# Patient Record
Sex: Female | Born: 1935
[De-identification: ages and names within clinical notes are randomized; demographics above are authoritative.]

---

## 2012-08-04 IMAGING — MG MAMMOGRAPHY SCREENING BILATERAL 3D TOMOSYNTHESIS WITH CAD
12 of 16 series · 12 of 32 positions shown · non-contrast
Comparison: Comparison is made to prior exams dating back to 07/09/06.

MAMMOGRAPHY SCREENING 3D TOMOSYNTHESIS WITH CAD, 08/04/12:
TECHNIQUE: Bilateral oblique mediolateral and craniocaudal full field
digital mammograms and 3D tomosynthesis were obtained.   In addition,
computer aided detection was utilized.
CLINICAL HISTORY: Screening.
BREAST DENSITY:  (Level 3 of 4) The breast tissue is heterogeneously dense.
This may lower the sensitivity of mammography.

[R CC synth-2D]
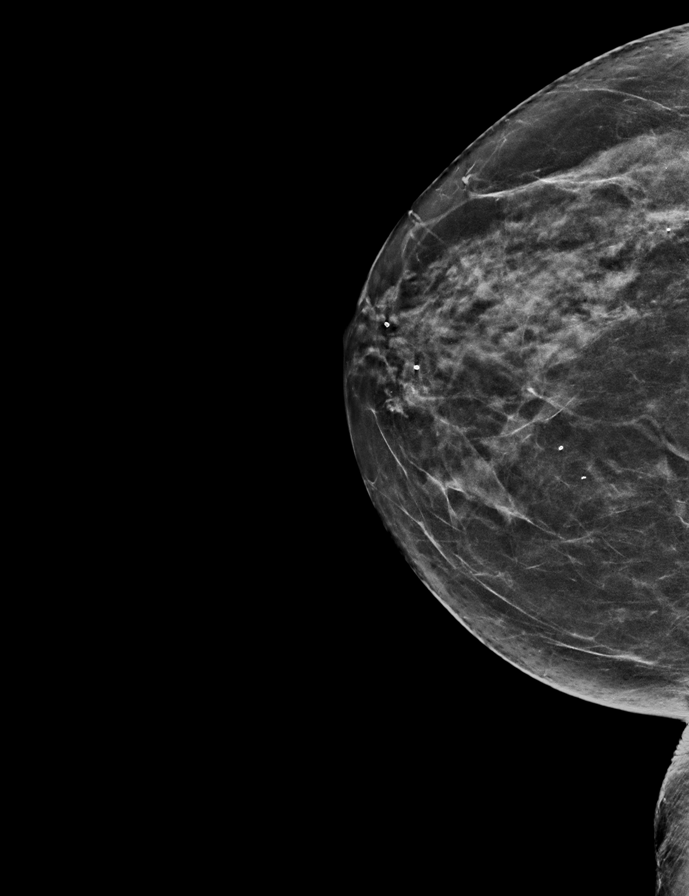

[R MLO]
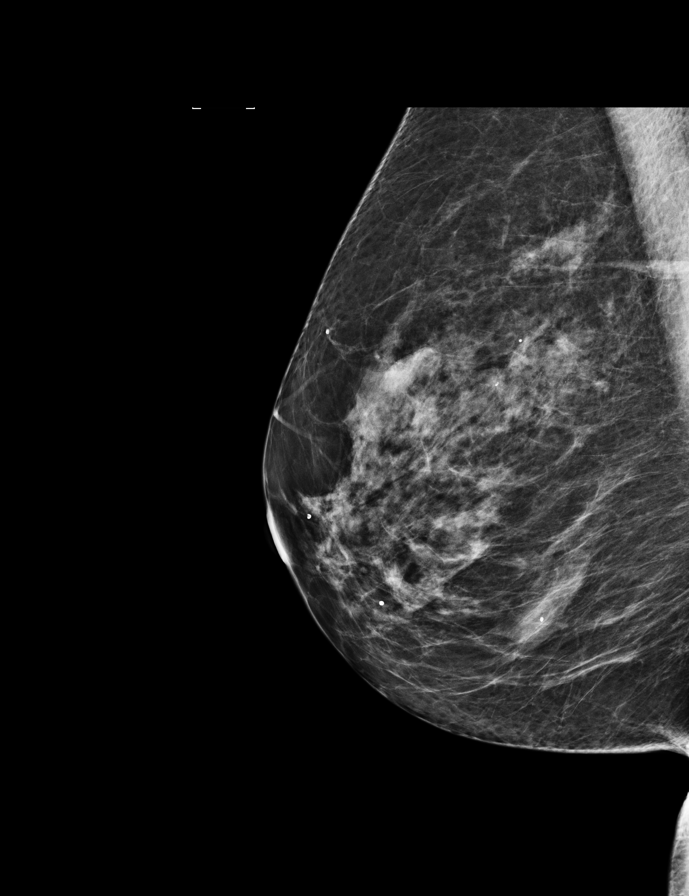

[L CC synth-2D]
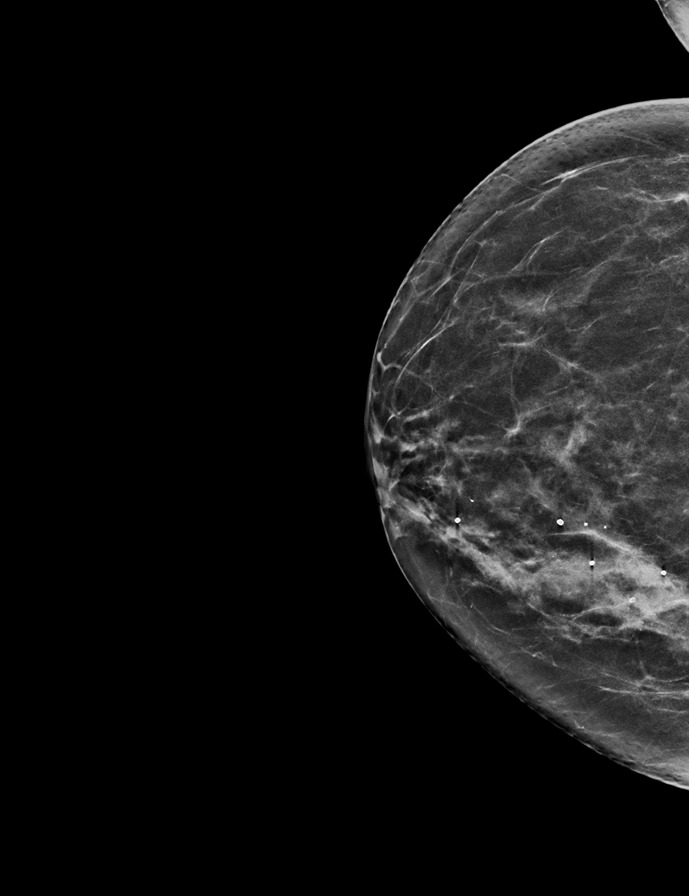

[R MLO synth-2D]
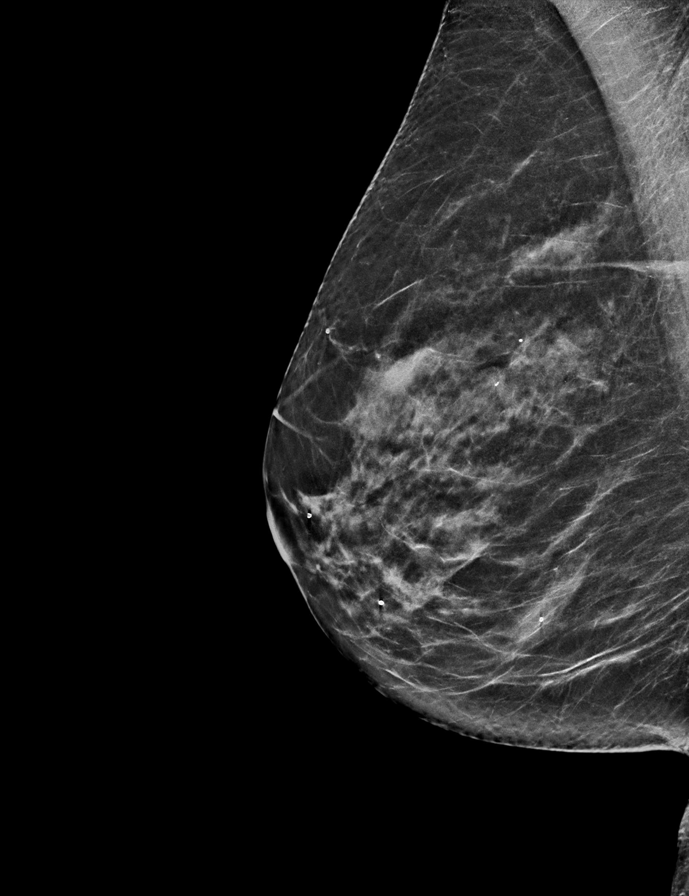

[L MLO]
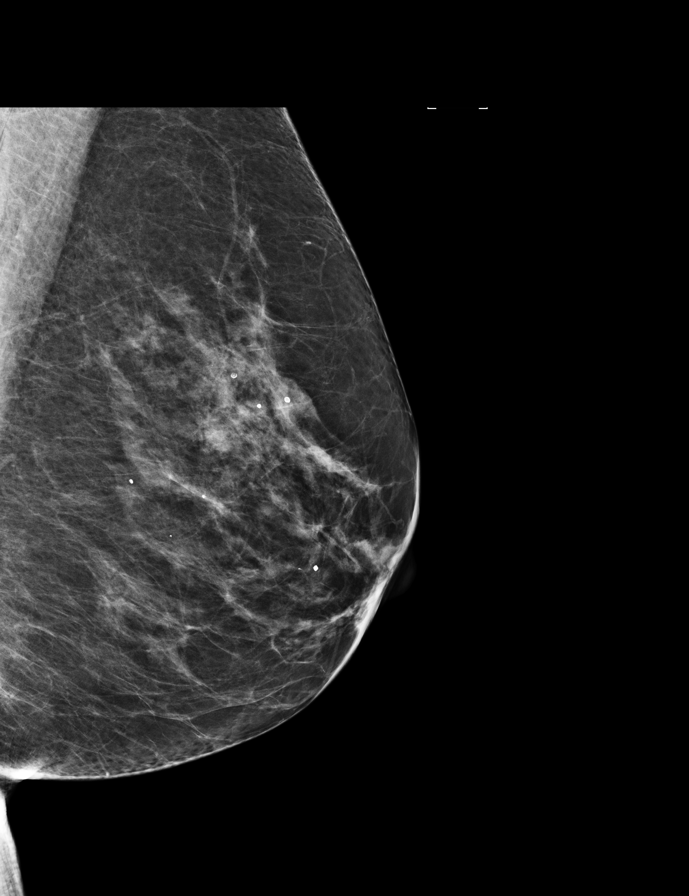

[L MLO synth-2D]
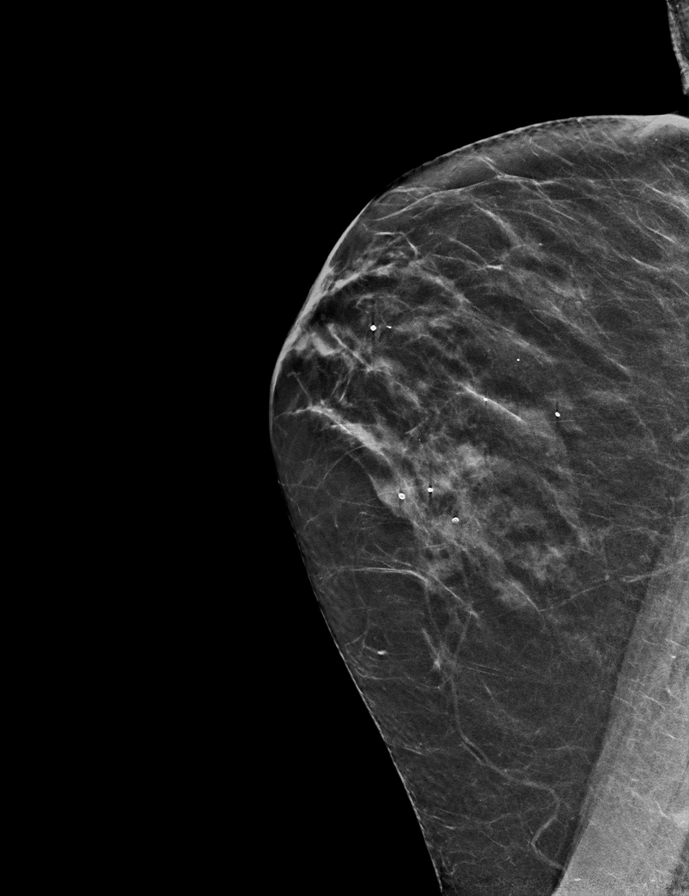

[R CC]
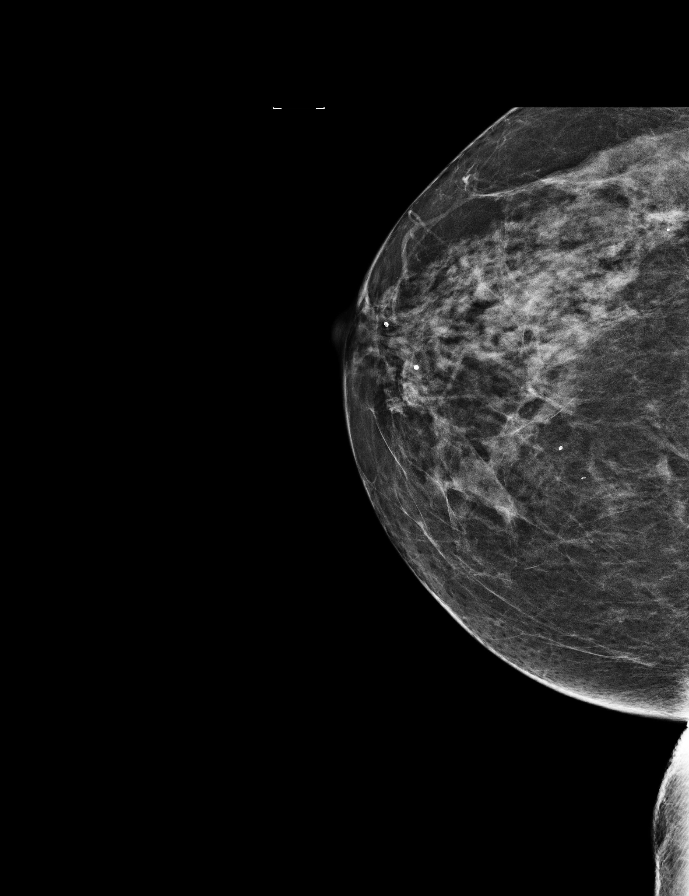

[L CC]
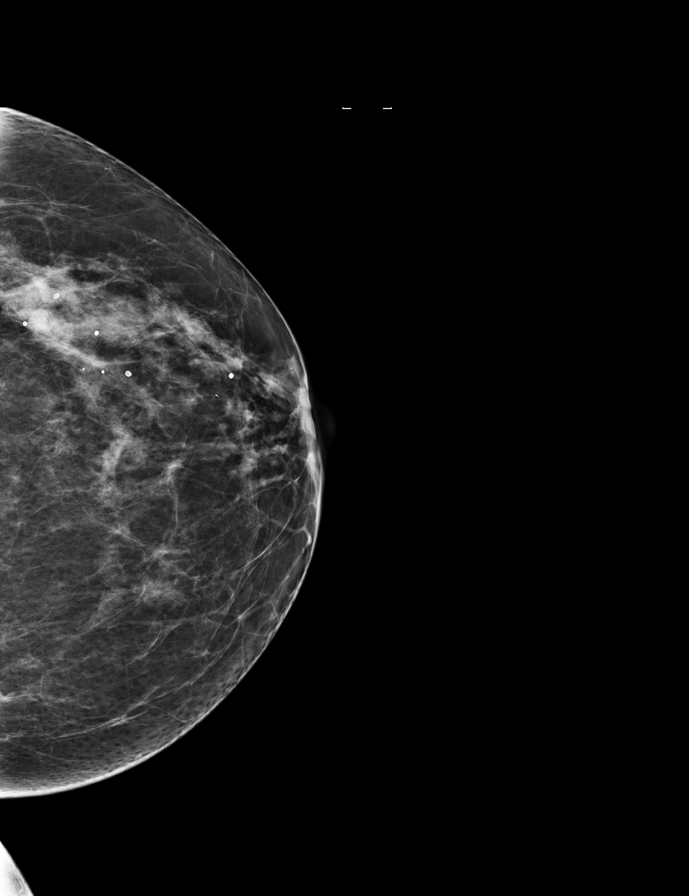

[R MLO tomo]
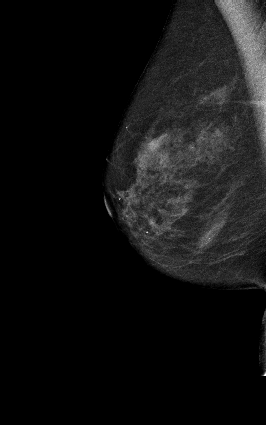

[L CC tomo]
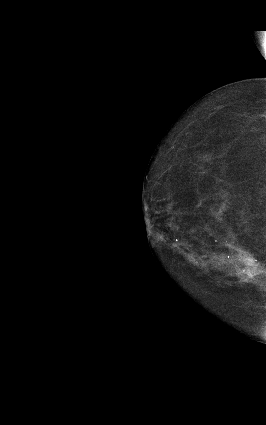

[R CC tomo]
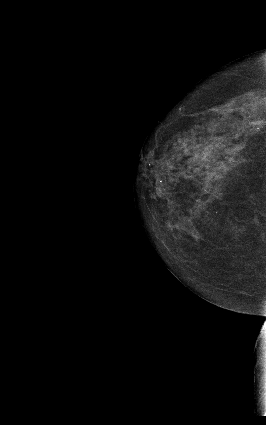

[L MLO tomo]
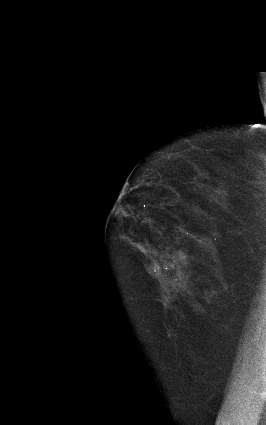

[12 of 32 positions shown; findings below may reference images not displayed]

FINDINGS: There are benign calcifications in both breasts.  There is a
developing nodule in the lateral left breast for which ultrasound is
recommended.
IMPRESSION: (BI-RADS 0) Further evaluation will be performed as discussed above, and
the results will be reported separately.

## 2013-08-28 IMAGING — MG MAMMOGRAPHY SCREENING BILATERAL 3D TOMOSYNTHESIS WITH CAD
12 of 16 series · 12 of 32 positions shown · non-contrast
Comparison: none

MAMMOGRAPHY SCREENING 3D TOMOSYNTHESIS WITH CAD, 08/28/2013 [DATE]:
Bilateral oblique mediolateral and craniocaudal full field digital mammogram
and
3-D tomosynthesis were obtained on 08/28/2013 in this patient undergoing
screening who is 80 years old.  In addition, computer-aided detection was
utilized.
Comparison is made to a study from 08/04/2012.
BREAST DENSITY: ( Level 3 of 4) The breast tissue is heterogeneously dense.
This
may lower the sensitivity of mammography.

[L MLO synth-2D]
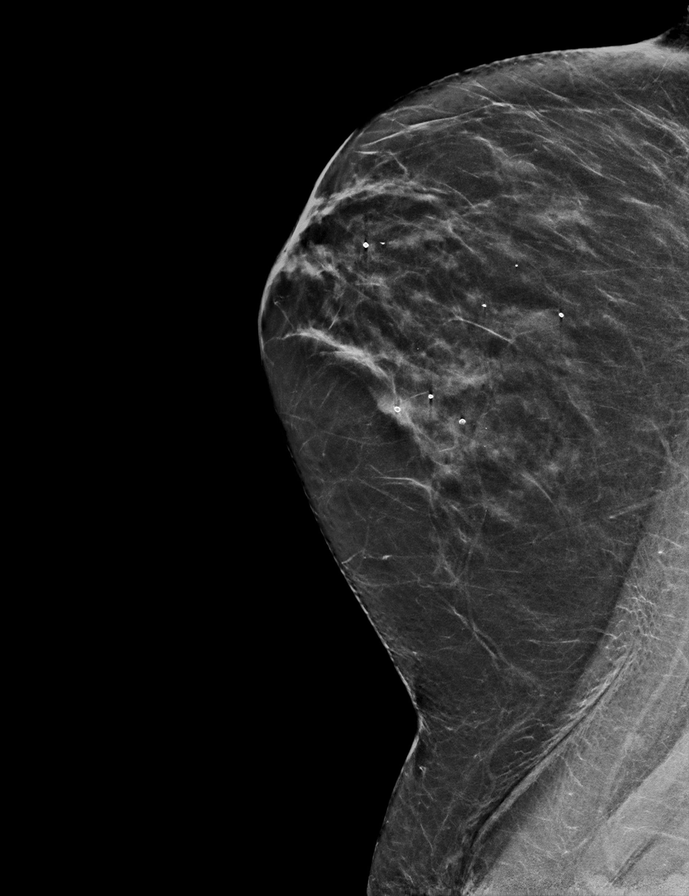

[L MLO]
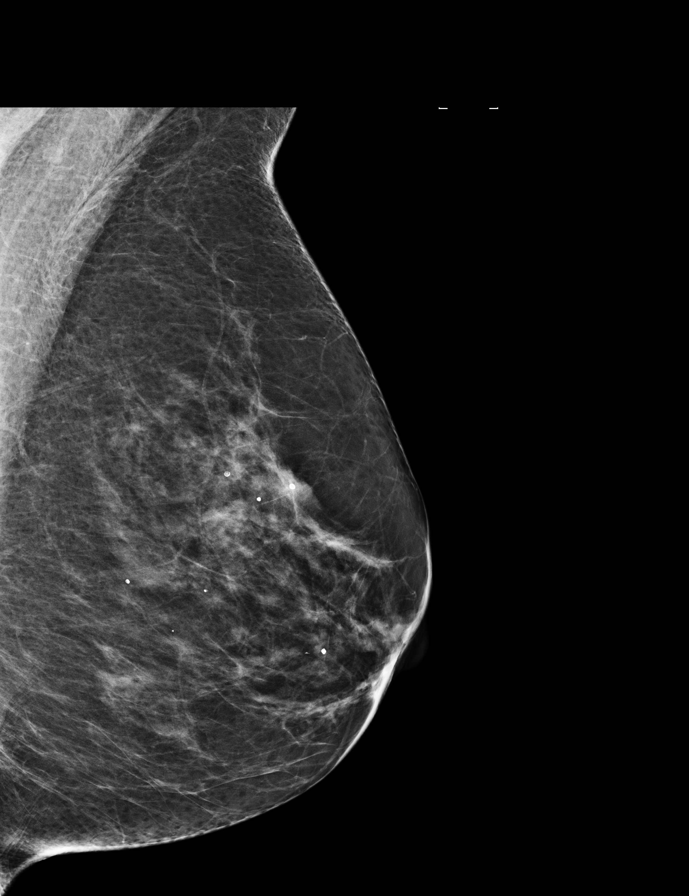

[R MLO synth-2D]
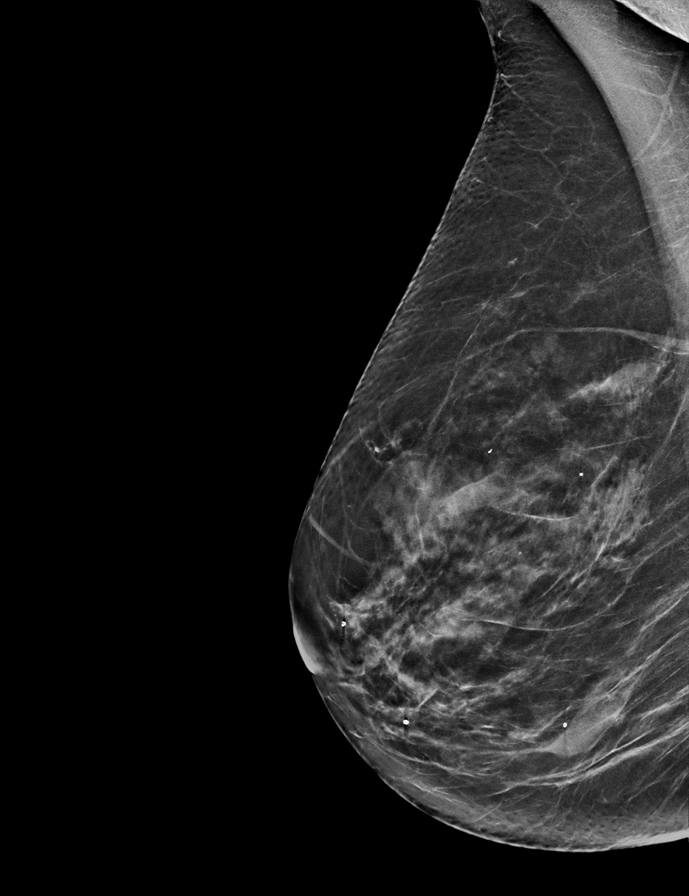

[L CC synth-2D]
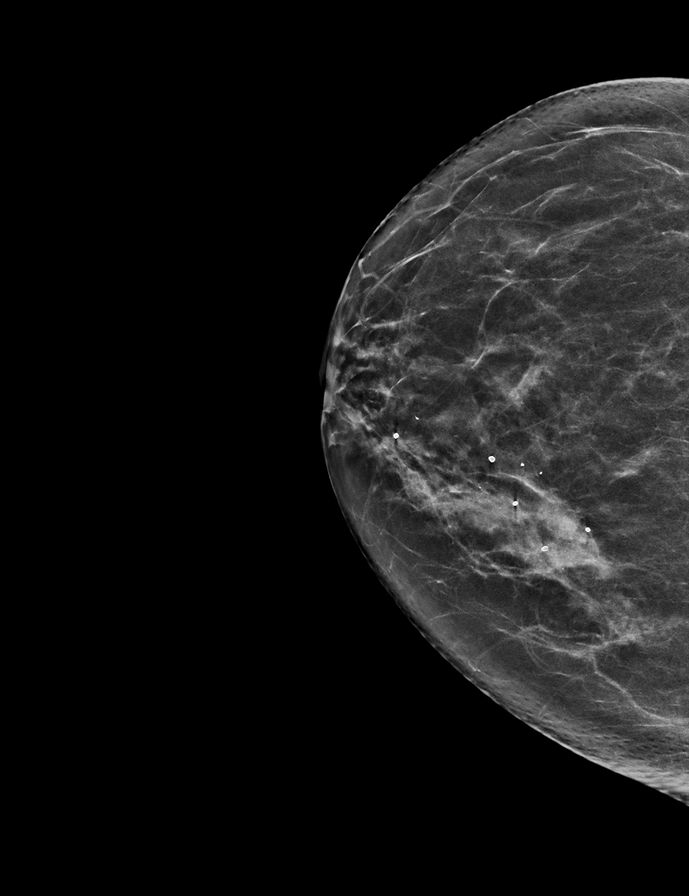

[R MLO]
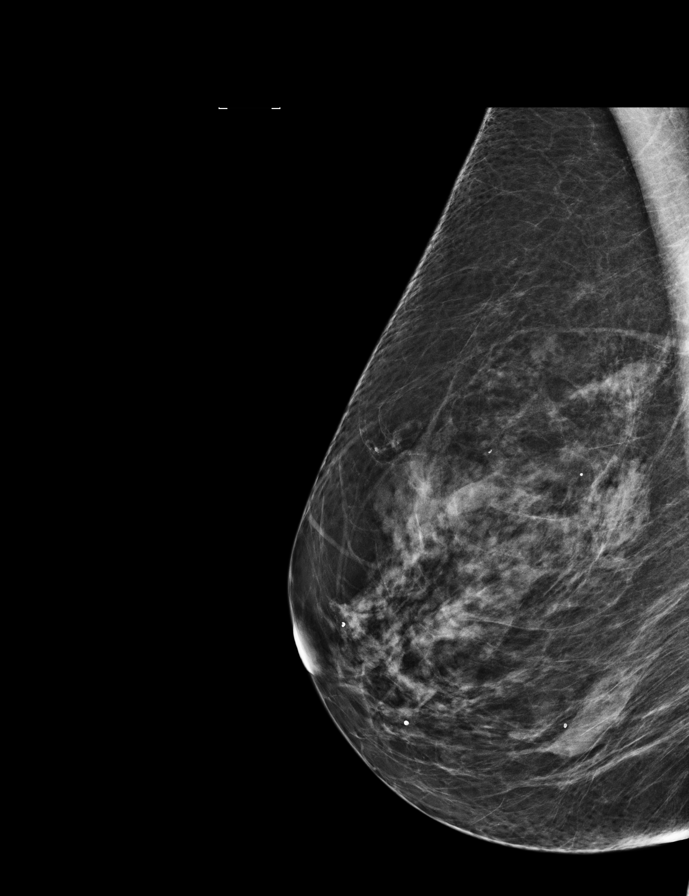

[R CC]
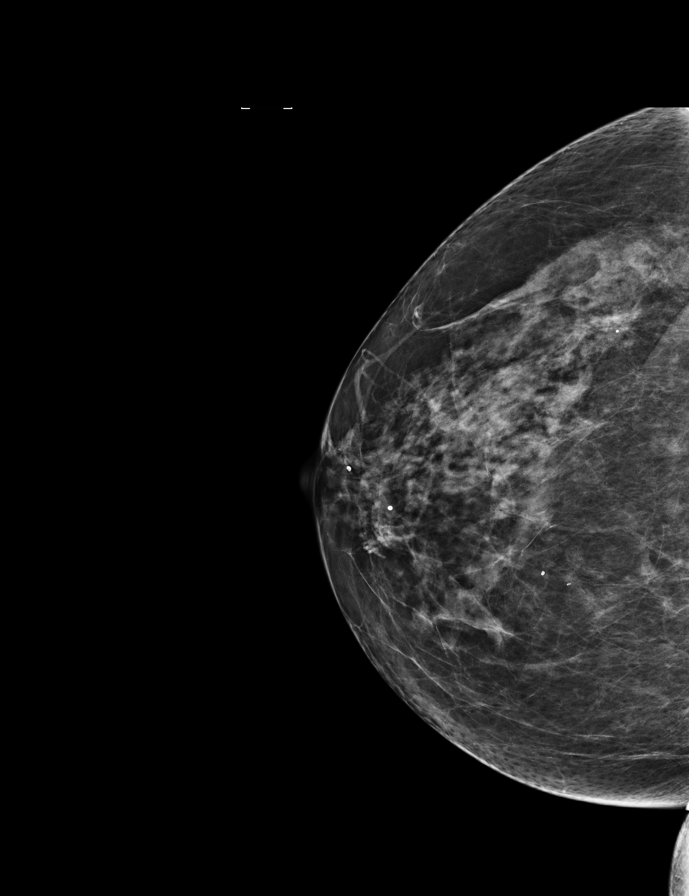

[L CC]
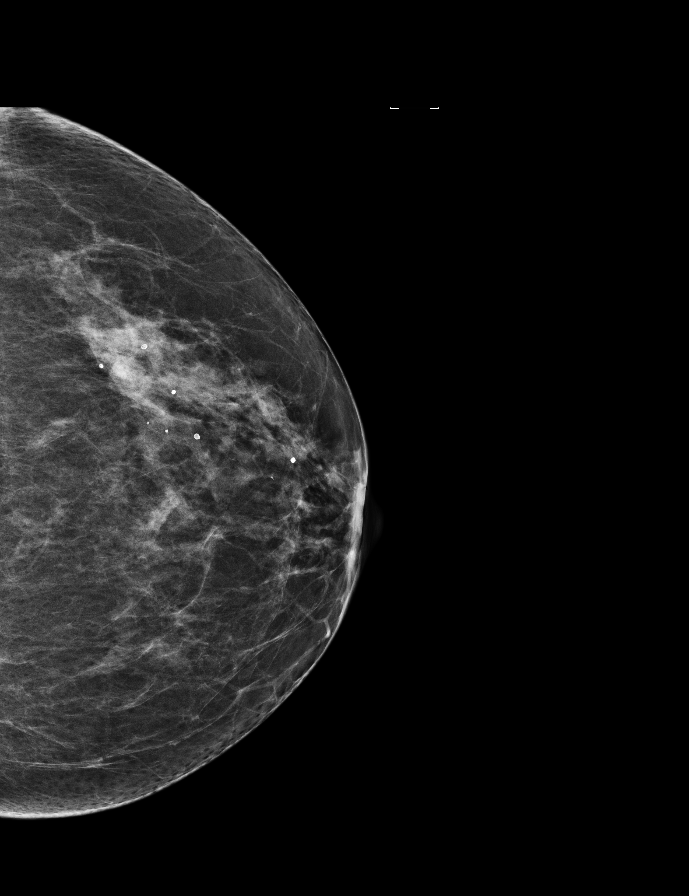

[R CC synth-2D]
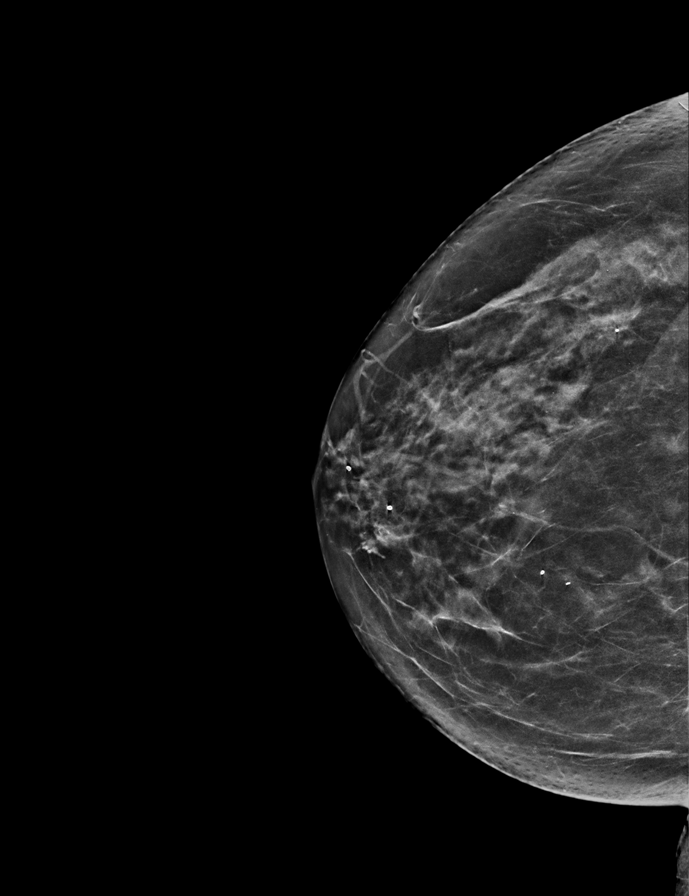

[R MLO tomo]
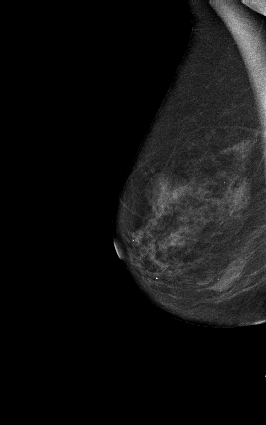

[L CC tomo]
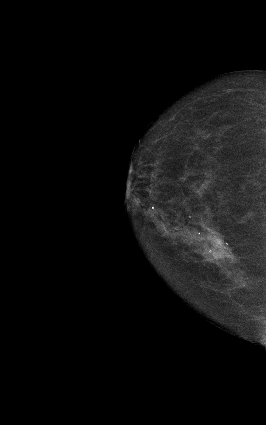

[R CC tomo]
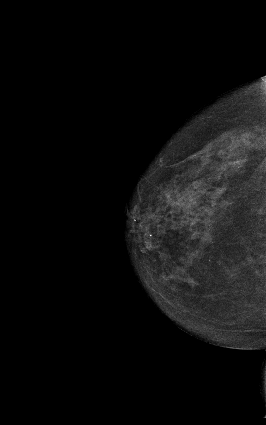

[L MLO tomo]
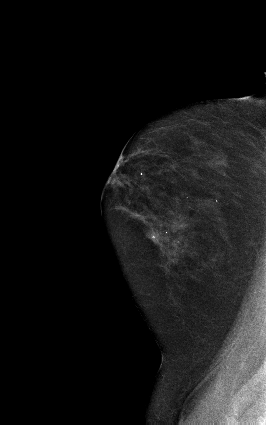

[12 of 32 positions shown; findings below may reference images not displayed]

FINDINGS: The breasts are symmetric. There are no dominant mass or clustered
microscopic calcifications. There is no skin thickening. The tomosynthesis
images demonstrate no area of concern. Benign calcifications are seen in both
breasts.
IMPRESSION: ( BI-RADS 2) Benign findings. Routine mammographic follow-up is recommended.

## 2014-08-31 IMAGING — MG MAMMOGRAPHY SCREENING BILATERAL 3D TOMOSYNTHESIS WITH CAD
12 of 16 series · 12 of 32 positions shown · non-contrast
Comparison: August 28, 2013
BREAST DENSITY: (Level C) The breasts are heterogeneously dense, which may
obscure small masses.

MAMMOGRAPHY SCREENING 3D TOMOSYNTHESIS WITH CAD, 08/31/2014 [DATE]:
CLINICAL INDICATION: Screening
TECHNIQUE: Bilateral oblique mediolateral and craniocaudal full field digital
mammogram and 3-D Tomosynthesis were obtained.  In addition, computer-aided
detection was utilized.

[R MLO]
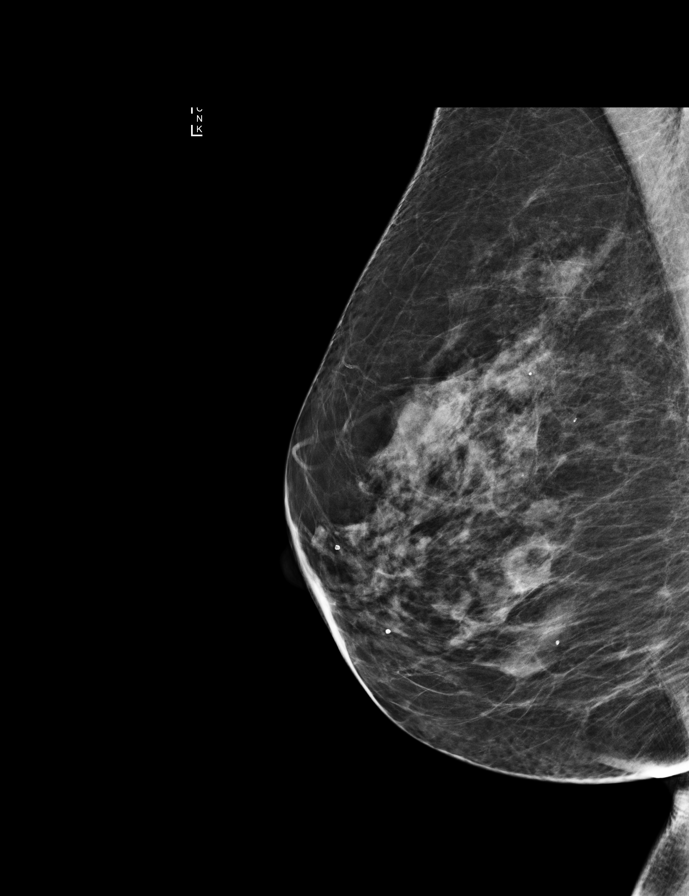

[R CC synth-2D]
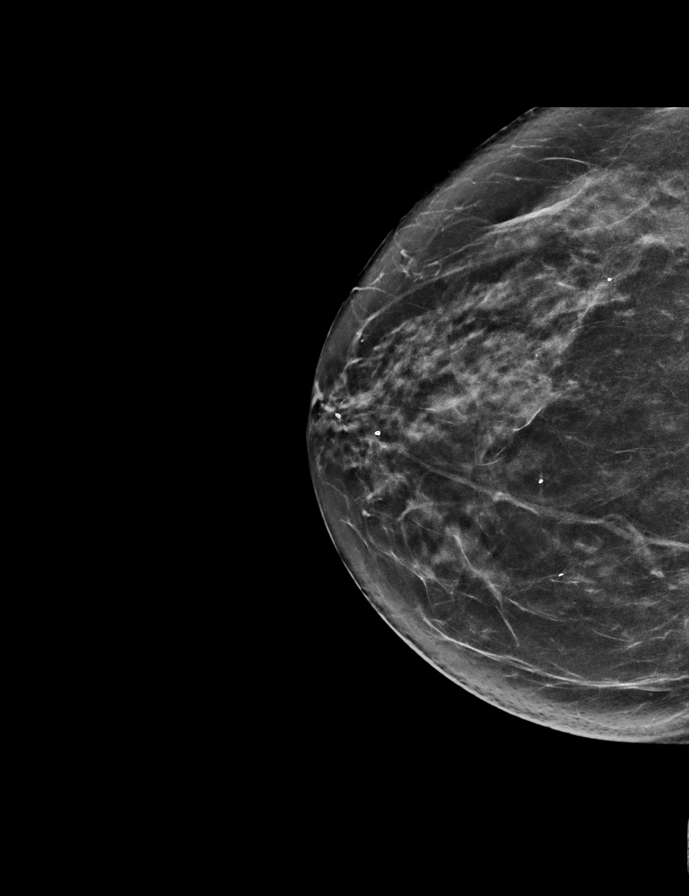

[L CC synth-2D]
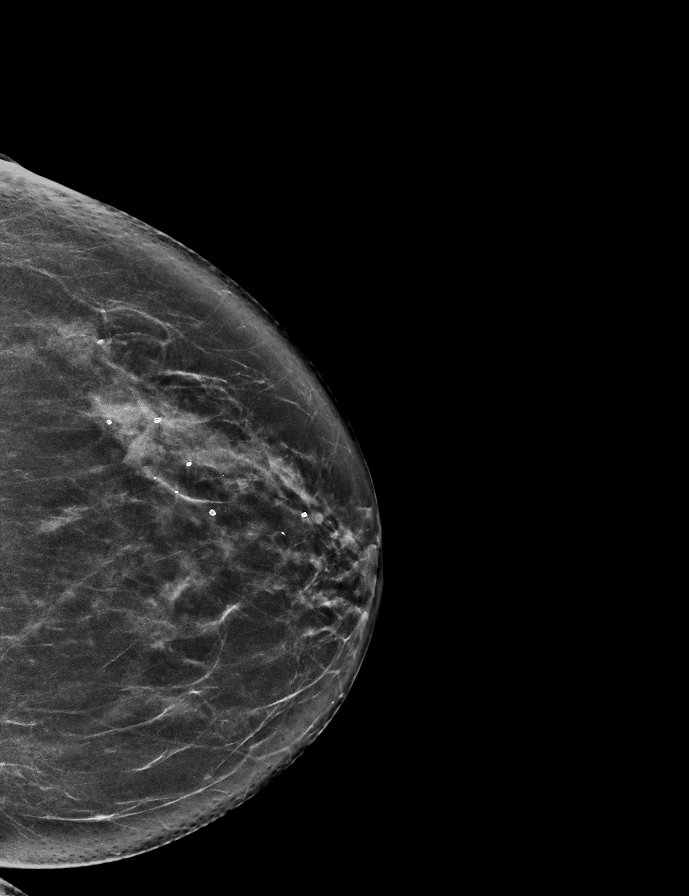

[L MLO]
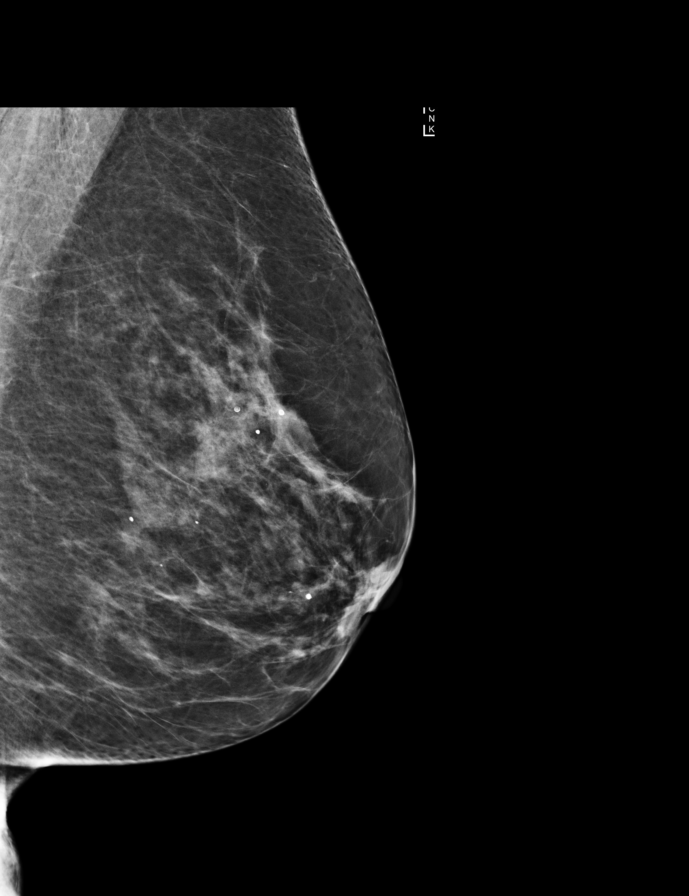

[R CC]
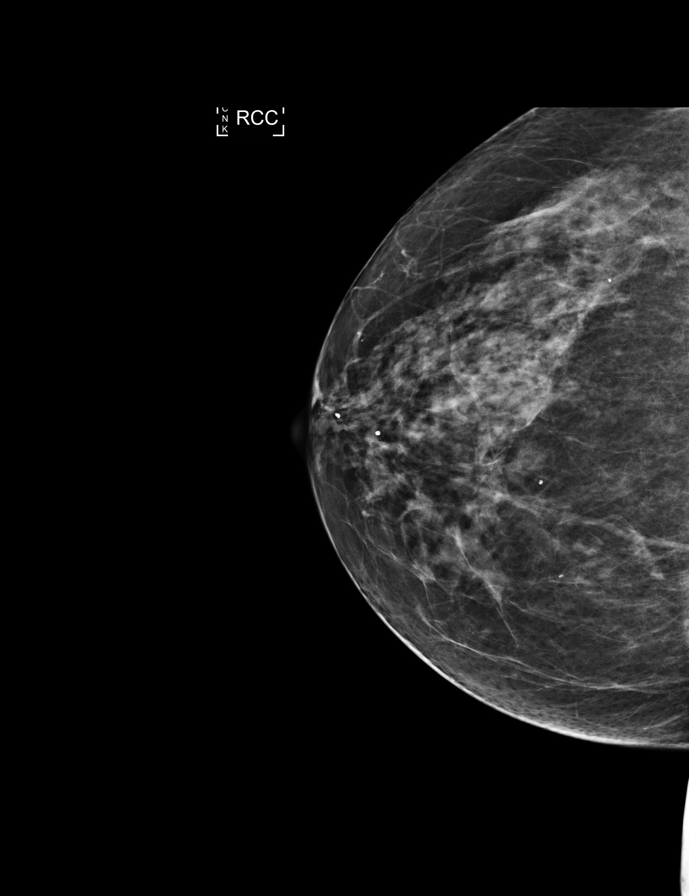

[L MLO synth-2D]
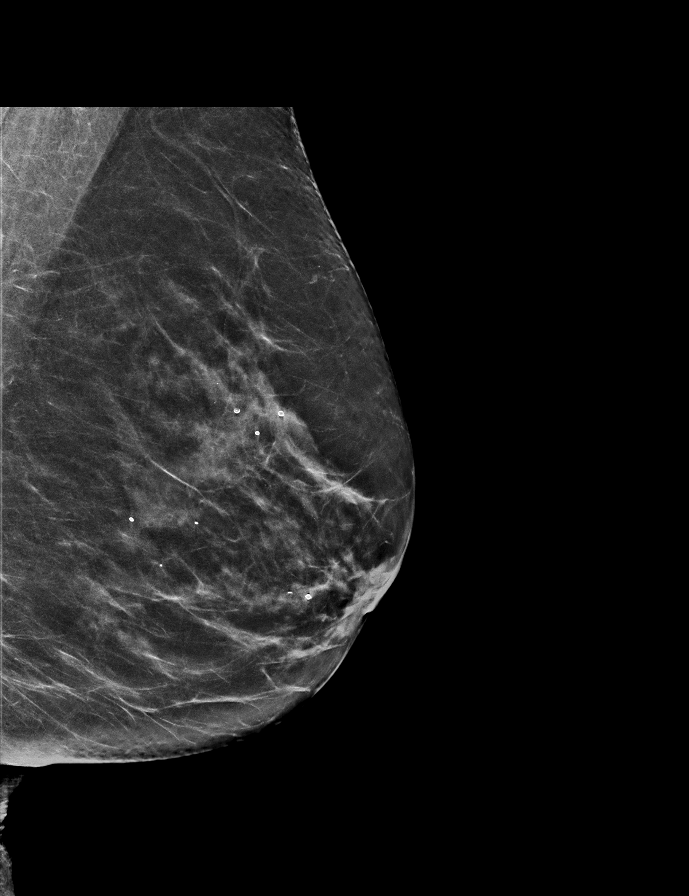

[R MLO synth-2D]
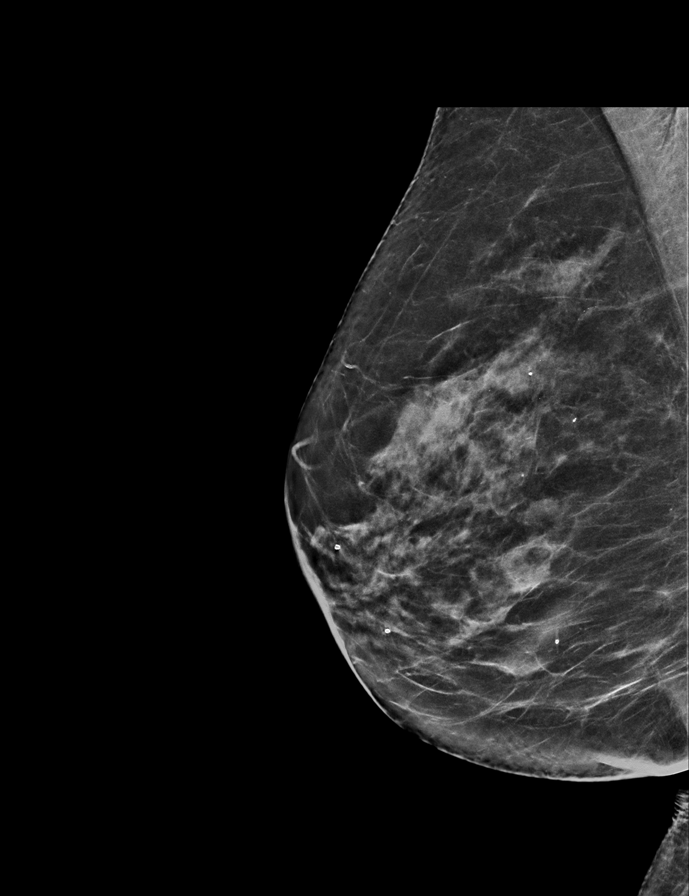

[L CC]
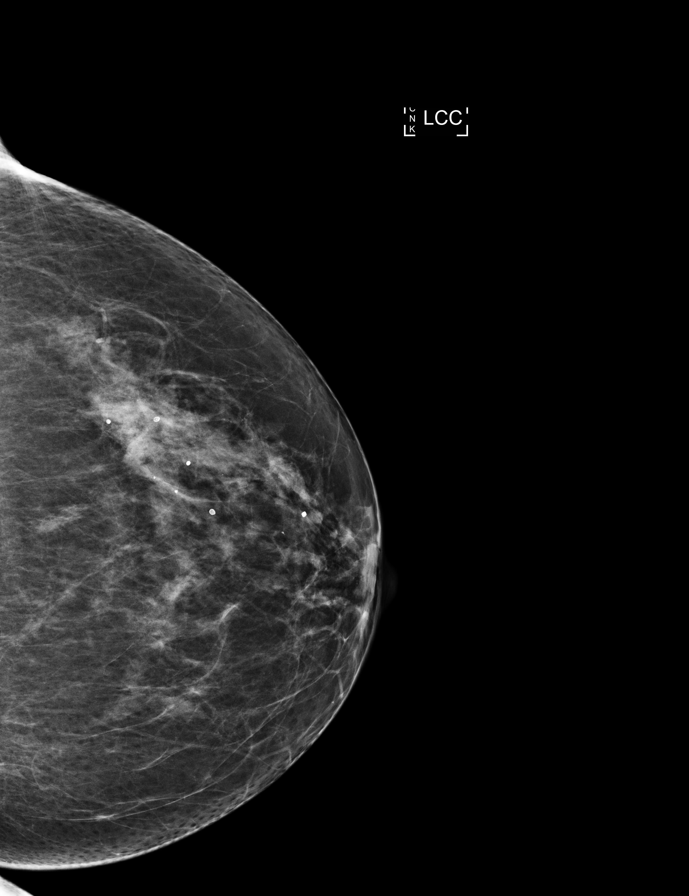

[L CC tomo]
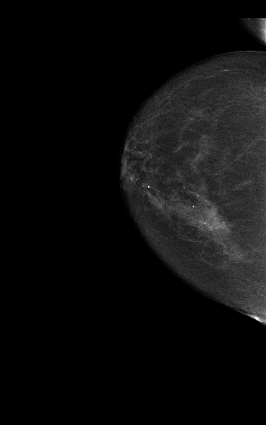

[R MLO tomo]
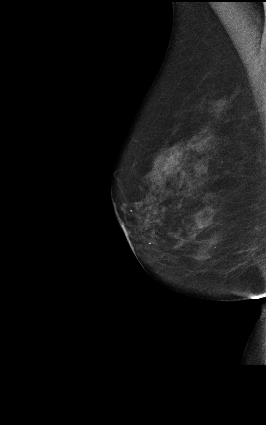

[R CC tomo]
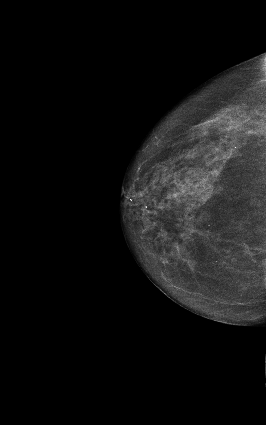

[L MLO tomo]
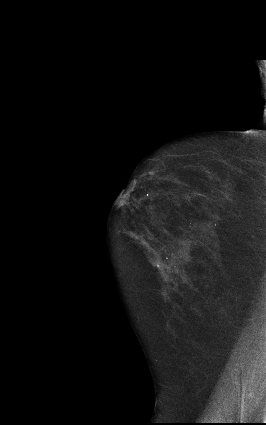

[12 of 32 positions shown; findings below may reference images not displayed]

FINDINGS: There are benign calcifications. There is no dominant or new,
developing mass or suspicious cluster of microcalcifications. There is no skin
thickening, architectural distortion or nipple retraction.
IMPRESSION: ( BI-RADS 2) Benign findings. Routine mammographic follow-up is recommended.

## 2015-04-29 IMAGING — CT CT ABDOMEN AND PELVIS WITH CONTRAST
2 of 3 series · 16 of 46 positions shown, 18 images · IV contrast (APPLIED)
Comparison: There are no prior exam(s) available for comparison within the
past
12 months; however, comparison was made to the prior exam(s) dated  CT scan
07/27/2010

CT ABDOMEN AND PELVIS WITH CONTRAST, 04/29/2015 [DATE]:
CLINICAL INDICATION:  Left lower quadrant pain. Evaluate for diverticulitis on
antibiotics for 2 days. Previous smoker.
A search for DICOM formatted images was conducted for prior CT imaging studies
completed at a non-affiliated media free facility.
TECHNIQUE: The region of interest was scanned with contrast on a high
resolution low dose CT scanner.   100 cc's of Isovue 300 was injected
intravenously.  Routine MPR reconstructions were performed.

[Series 5: portal 3.0 i41s 2 · axial · portal-venous · 0.79mm/px · z∈[-481,-94]mm · 13 of 149 slices shown, 15 images]
[im 10/149  soft-tissue]
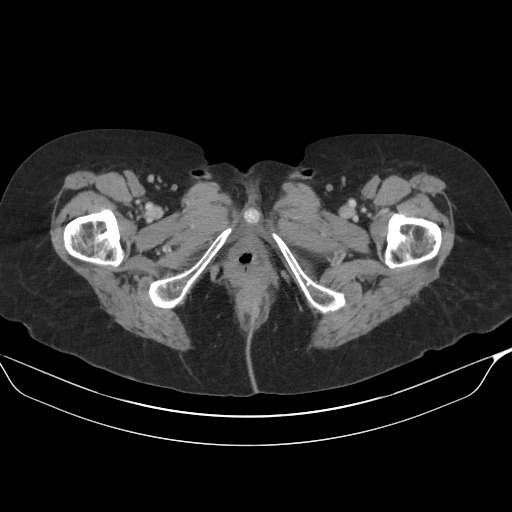
[im 10/149  bone]
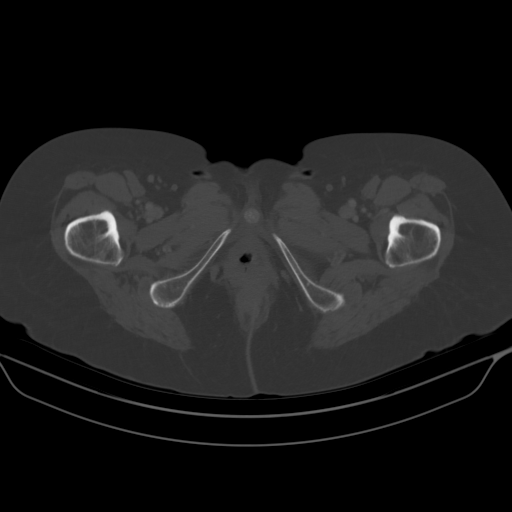
[im 20/149  soft-tissue]
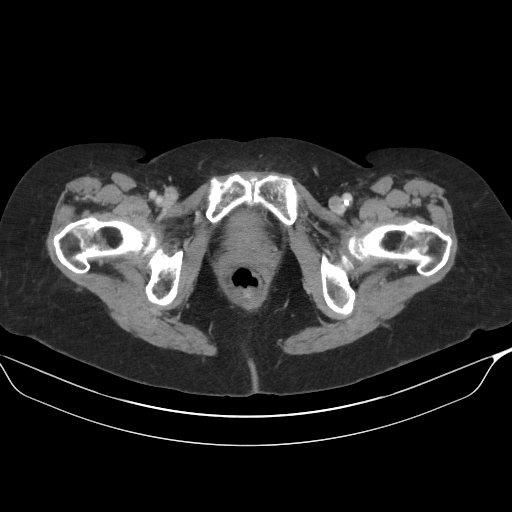
[im 29/149  soft-tissue]
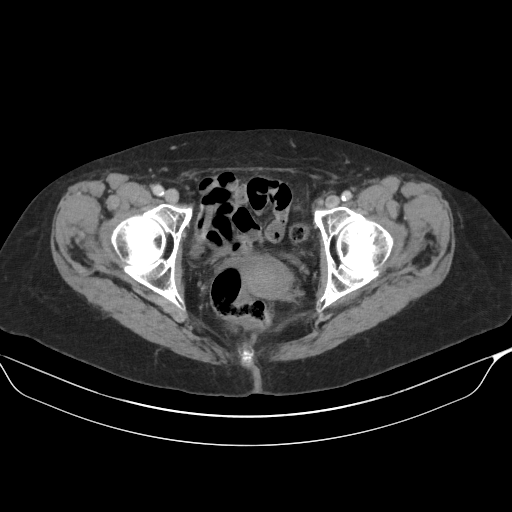
[im 43/149  soft-tissue]
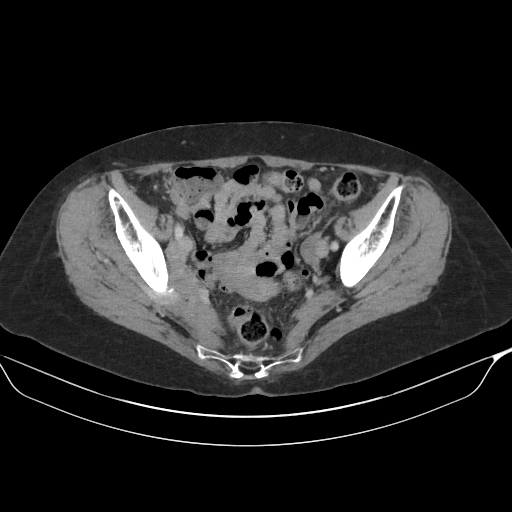
[im 53/149  soft-tissue]
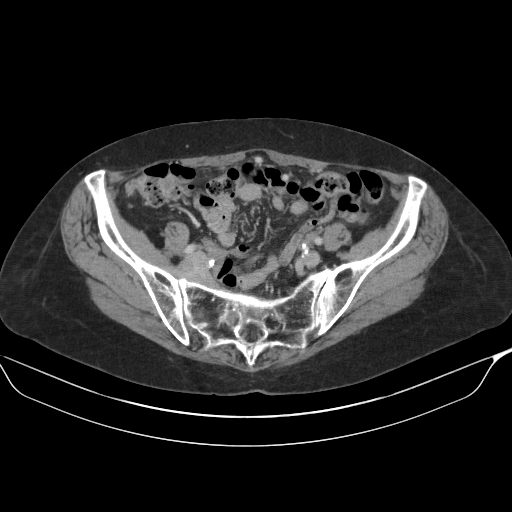
[im 63/149  soft-tissue]
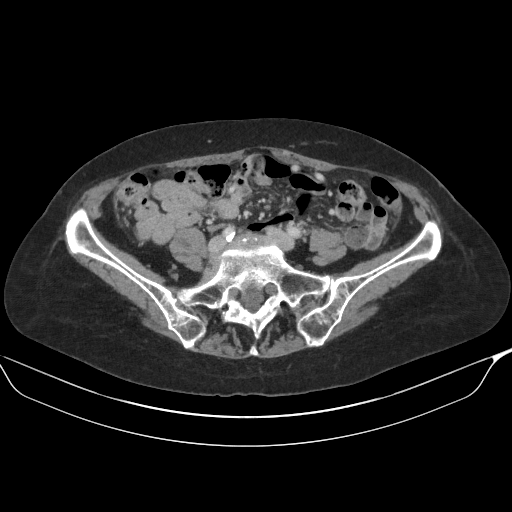
[im 77/149  soft-tissue]
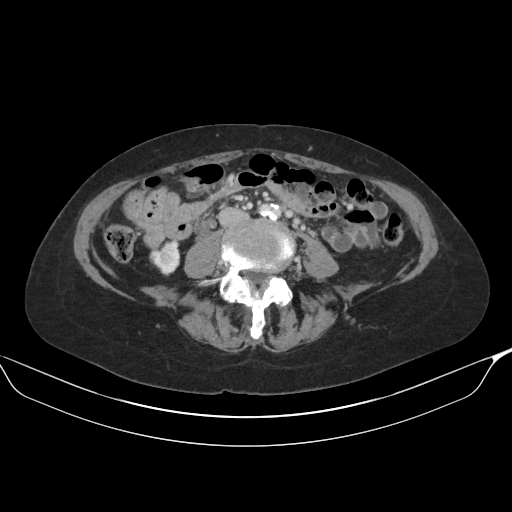
[im 86/149  soft-tissue]
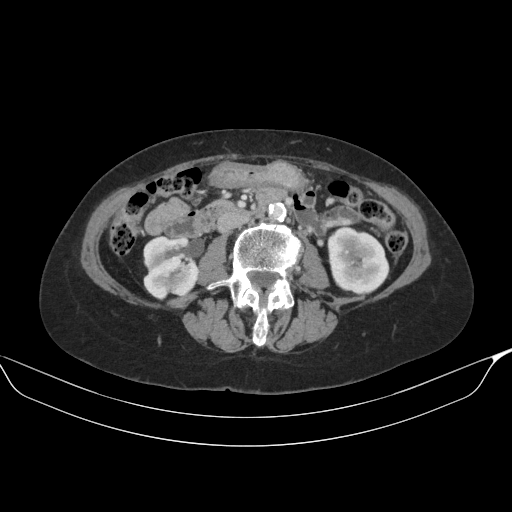
[im 96/149  soft-tissue]
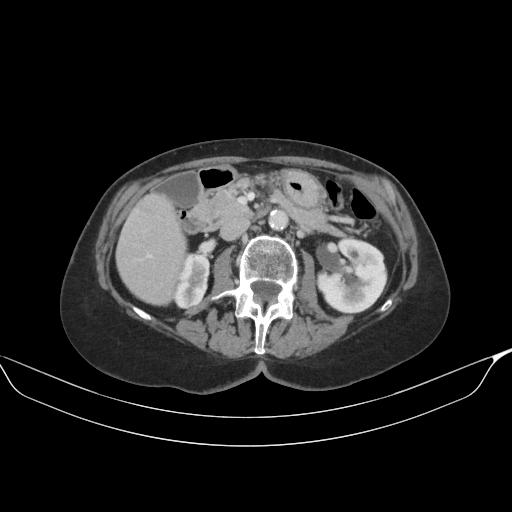
[im 96/149  bone]
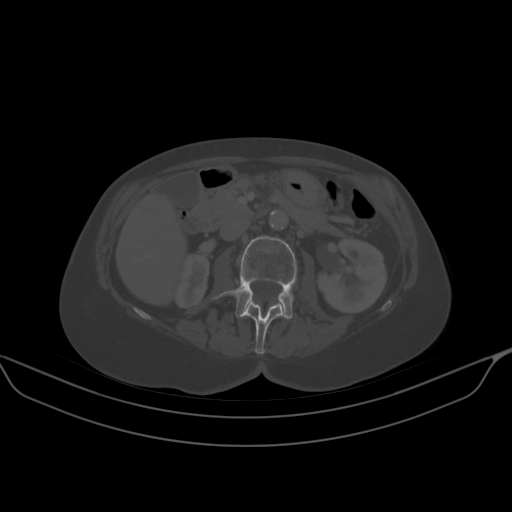
[im 106/149  soft-tissue]
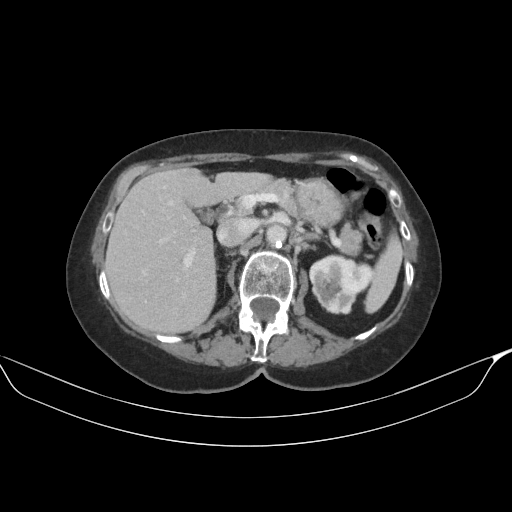
[im 120/149  soft-tissue]
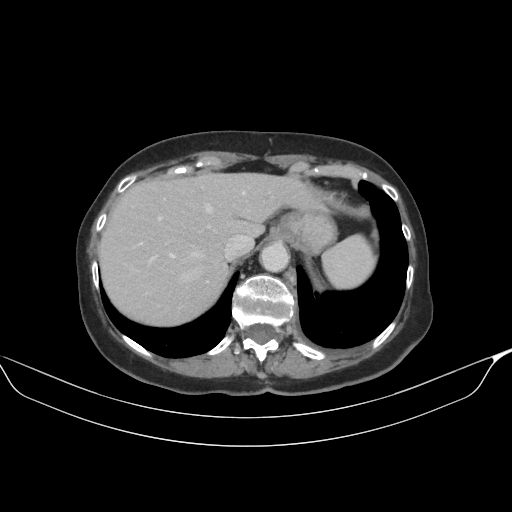
[im 129/149  soft-tissue]
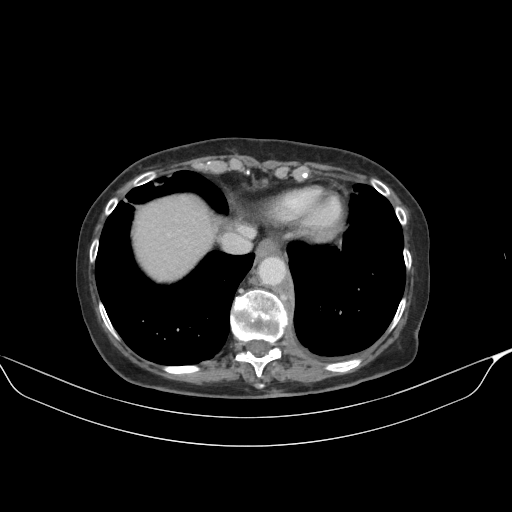
[im 139/149  soft-tissue]
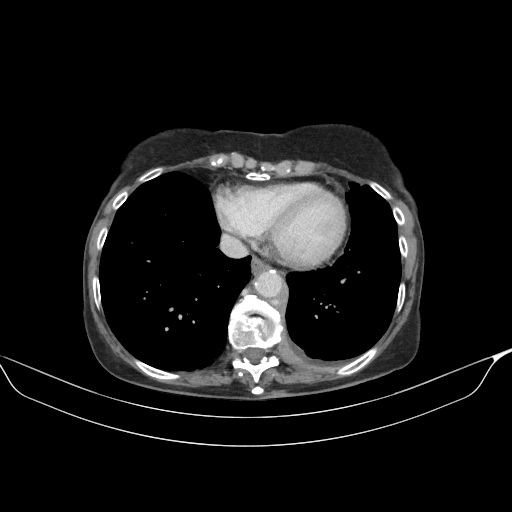

[Series 6: coronal · coronal · 0.79mm/px · 3 of 101 slices shown]
[im 34/101  soft-tissue]
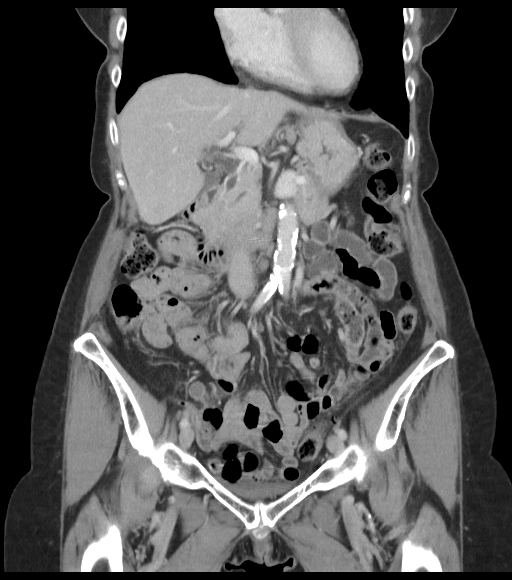
[im 45/101  soft-tissue]
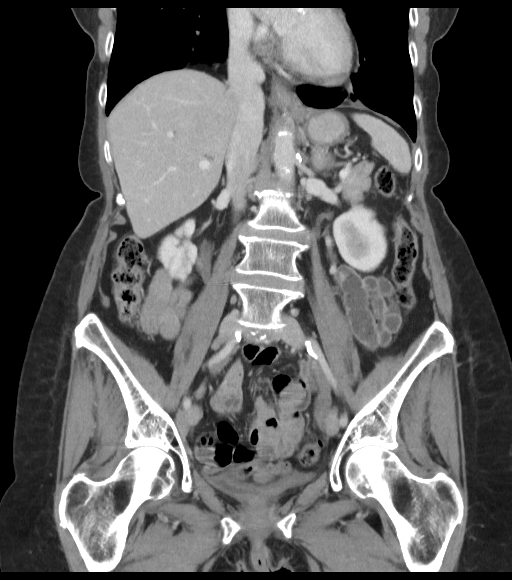
[im 56/101  soft-tissue]
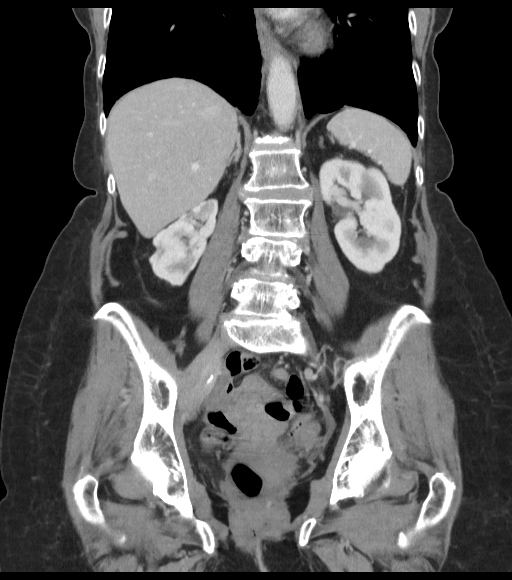

[16 of 46 positions shown; findings below may reference images not displayed]

FINDINGS: There appears to be progressive inflammatory changes within the
lingula, right middle lobe and right lower lobe since the prior exam. This has
progressed and is concerning for bronchiolitis possibly even an atypical
mycobacterial infection. Not fully evaluated on this exam. There appears to be
a
developing small left pleural effusion since the prior exam. There also
appears
to be pleural thickening and increased soft tissue seen deep to the crus of
the
left diaphragm and along the left paraspinous region. This does not deviate
the
course of the adjacent venous structures. The etiology of this is uncertain.
The
significance is uncertain. Interestingly this is somewhat similar to the
abnormality seen within the pelvis deep to the right iliac vessels which was
described as stable on the prior exam and seen on exams dating all back to
8660.
There are atherosclerotic changes and there are degenerative changes. Small
cysts are seen within the liver. The liver, spleen, pancreas and adrenal
glands
are normal in appearance. There is lobulation of the kidneys bilaterally
chronic
in nature either related to old inflammation or possible fetal lobulations. No
new adenopathy or mass seen. Stable appearance the pelvis. The bladder is
unremarkable in appearance. Mild inflammatory changes are seen around a
diverticulum within the sigmoid colon without abscess or free fluid seen.
IMPRESSION: Findings suspicious for mild sigmoid diverticulitis.
Unusual increased soft tissue in thickening along the medial aspect of the
left
lower thorax extending down to the crus of the diaphragm on the left. This is
a
new finding since the 1300 exam. May be inflammatory though is not deviating
the
course of the underlying vasculature. PET scan may be beneficial to determine
if
this is FDG avid and if it possibly requires biopsy.
New trace left pleural effusion.
Progressive inflammatory changes in the lung bases.
Suspect chronic fetal lobulation of the kidneys.
Atherosclerotic change and degenerative changes.
Chronic increased soft tissue right posterior pelvis similar to the prior exam
and stated to be similar exams dated back to [DATE].

## 2015-05-13 IMAGING — PT PET CT SCAN TUMOR IMAGING SKULL TO THIGH
3 series · 25 of 25 positions shown · non-contrast
Comparison: CT scan 04/29/2015

PET CT SCAN TUMOR IMAGING SKULL TO THIGH, 05/13/2015 [DATE]:
CLINICAL INDICATION:  Abnormal CT scan with abnormal soft tissue left lower
thorax
TECHNIQUE: A dose of 12.7 millicuries of 18-FDG was administered intravenously
and total body PET scanning was performed at 75 minutes. Tomographic scans
were
reconstructed in axial, coronal, and sagittal projections. The data was
reconstructed into a three-dimensional volume rendered image and reviewed in a
rotational cine loop. Serum blood glucose at the time of injection was 99
mg/dl.

[Series 2: body-low dose ct · axial · 5.0mm · 1.17mm/px · z∈[-984,-44]mm · 7 of 189 slices shown]
[im 1/189]
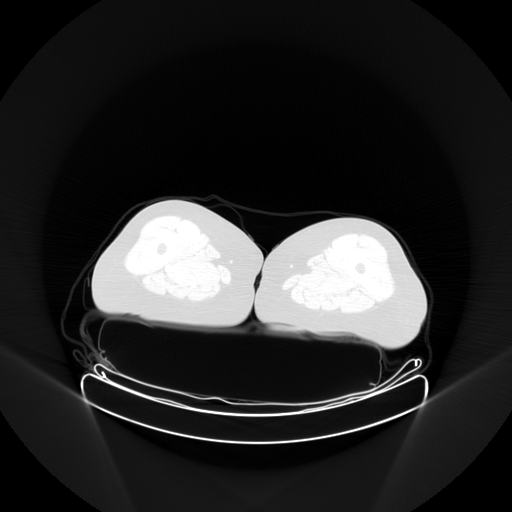
[im 32/189]
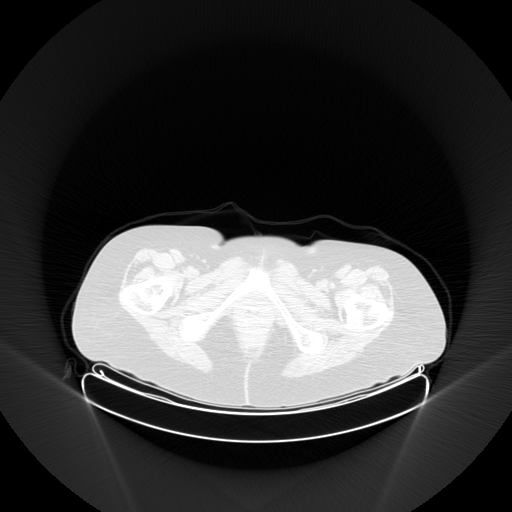
[im 63/189]
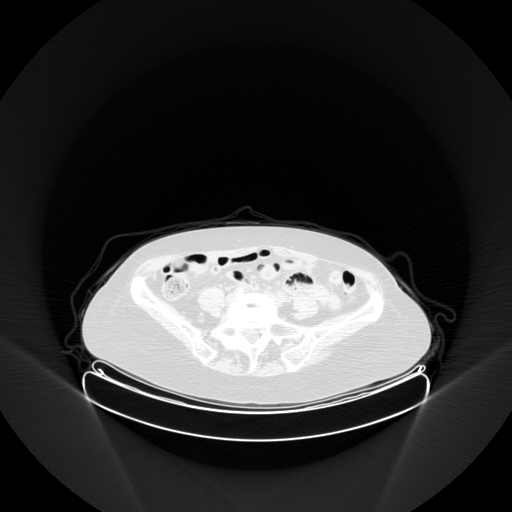
[im 95/189]
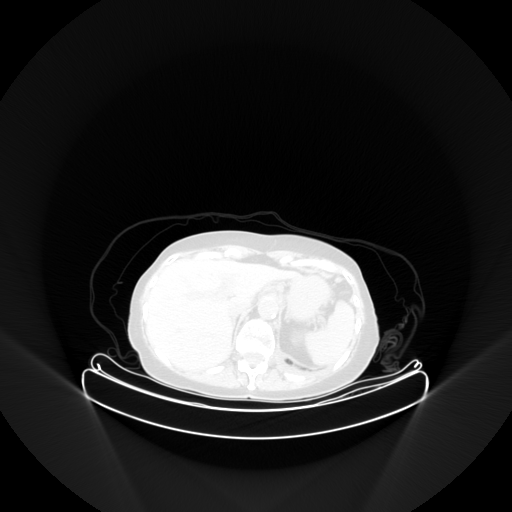
[im 126/189]
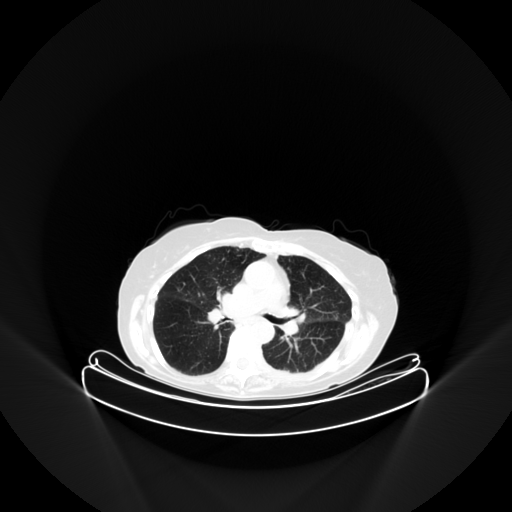
[im 157/189]
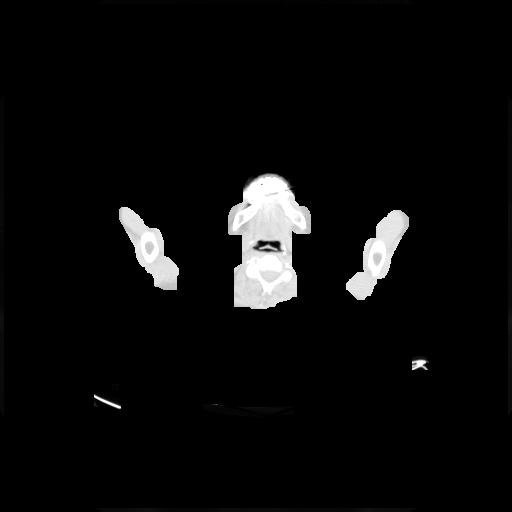
[im 189/189]
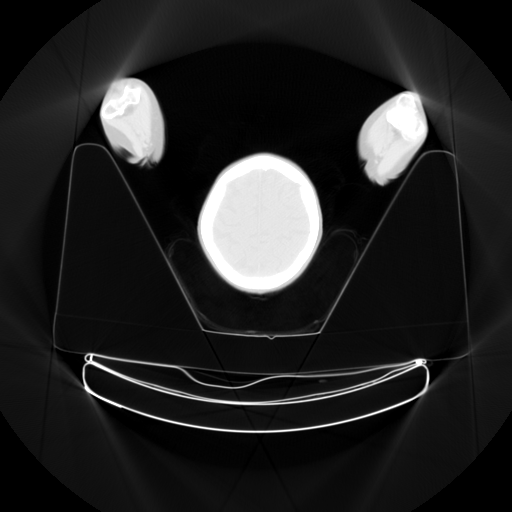

[(id)_wb_ctac.img: (id) · axial · 4.0mm · 4.00mm/px · z∈[-980,-48]mm · 9 of 234 slices shown]
[im 1/234]
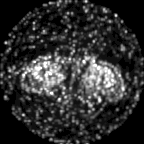
[im 30/234]
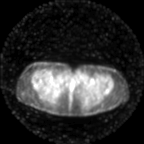
[im 59/234]
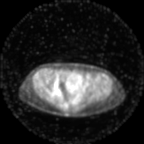
[im 88/234]
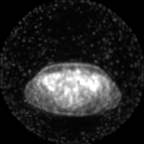
[im 117/234]
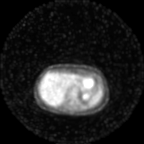
[im 146/234]
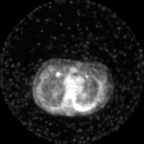
[im 175/234]
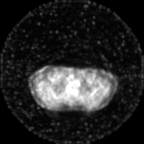
[im 204/234]
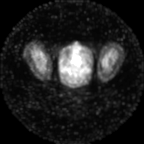
[im 234/234]
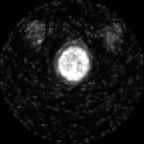

[(id)_wb_nac.img: (id) · axial · 4.0mm · 4.00mm/px · z∈[-980,-48]mm · 9 of 234 slices shown]
[im 1/234]
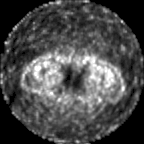
[im 30/234]
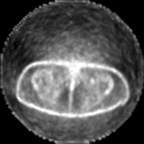
[im 59/234]
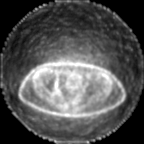
[im 88/234]
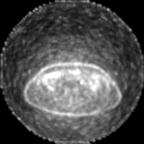
[im 117/234]
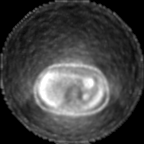
[im 146/234]
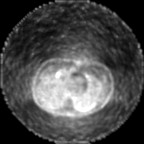
[im 175/234]
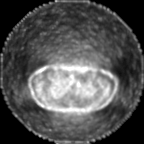
[im 204/234]
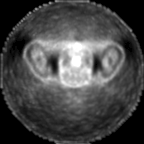
[im 234/234]
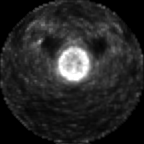

[25 of 25 positions shown; findings below may reference images not displayed]

FINDINGS: The low-dose CT revealed atherosclerotic changes and degenerative
changes. Small left effusion. The increased density seen involving the left
pleura and extending around the descending thoracic aorta is once again
identified. Reactive the soft tissue extending into the right internal iliac
region is also unchanged. Atherosclerotic changes and degenerative changes.
Suspect inflammatory changes are seen within the lungs most prominently within
the right middle lobe and anterior aspect the right lower lobe.
The whole body pet images reveal increased activity overlying especially the
right middle and right lower lobe and to lesser degree lingula. Thought to
represent inflammatory changes within the lungs. There is low-grade activity
corresponding to the region of the left pleura and the right pelvis. The right
pelvic findings have been seen since at least 9355 have been grossly stable.
The
maximum SUV in the left thorax is 2.6 and the right posterior pelvis [DATE].
Given
the slowly progressive changes left thorax and the relatively stable change in
the pelvis this is more likely a nonmalignant process. Differential would
include a fibrosis which is mildly progressive versus other etiology such as
extra medullary hematopoiesis. Recommend correlation with bone marrow activity
and any history of anemia.
IMPRESSION: Unusual soft tissue within the right posterior pelvis and slightly progressive
within the left lower medial thorax. Not a typical neoplastic appearance. More
likely a fibrotic process versus etiologies such as extramedullary
hematopoiesis. The significance of this is thought to be doubtful given the
very
slow progression since 9355.
Underlying inflammatory changes right middle lobe, right lower lobe and to a
lesser degree lingula. Nonspecific. Atypical mycobacterial infection would be
in
the differential.
Atherosclerotic changes and degenerative changes.

## 2016-07-25 IMAGING — CT CT CHEST WITHOUT CONTRAST
2 of 4 series · 15 of 36 positions shown, 18 images · non-contrast
Comparison: There are no prior exam(s) available for comparison within the
past
12 months; however, comparison was made to the prior exam(s) dated  PET scan
April 2015

CT CHEST WITHOUT CONTRAST, 07/25/2016 [DATE]:
CLINICAL INDICATION: Fibrothorax. COPD. Quit smoking back in 6885.
A search for DICOM formatted images was conducted for prior CT imaging studies
completed at a non-affiliated media free facility.
TECHNIQUE: The region of interest was scanned without contrast on a high
resolution low dose CT scanner. 3-D renderings were reconstructed on an
independent workstation. As per the [HOSPITAL] guidelines 3-
D
reconstructions are performed with concurrent physician supervision.

[Series 3: chest w/o 2.0 i31s 3 · axial · non-contrast · 0.70mm/px · z∈[-384,-70]mm · 12 of 187 slices shown, 15 images]
[im 15/187  mediastinal]
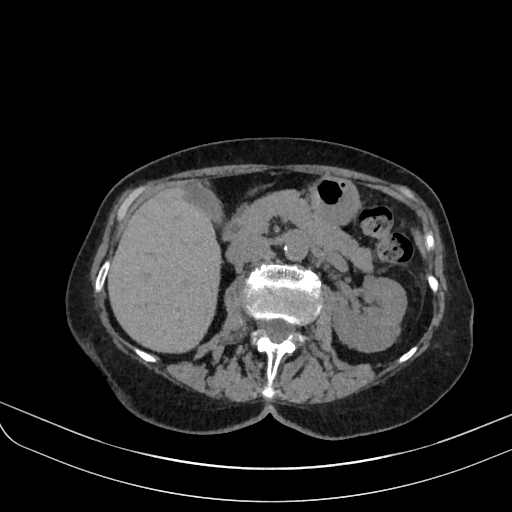
[im 15/187  lung]
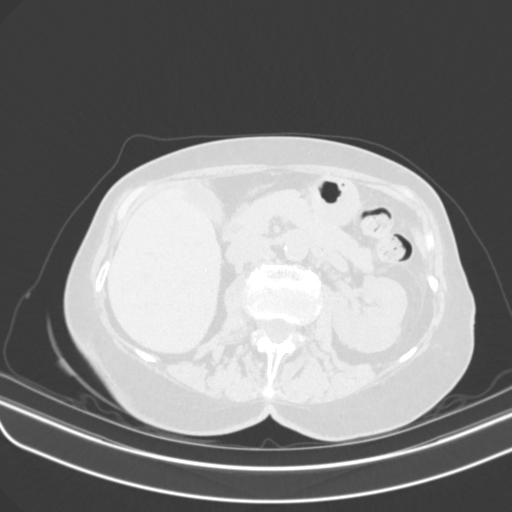
[im 29/187  lung]
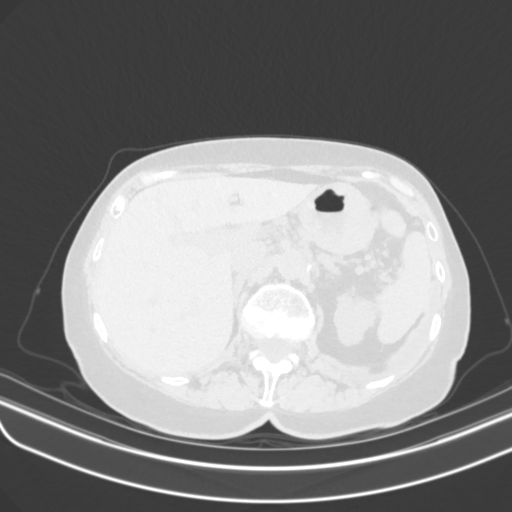
[im 43/187  lung]
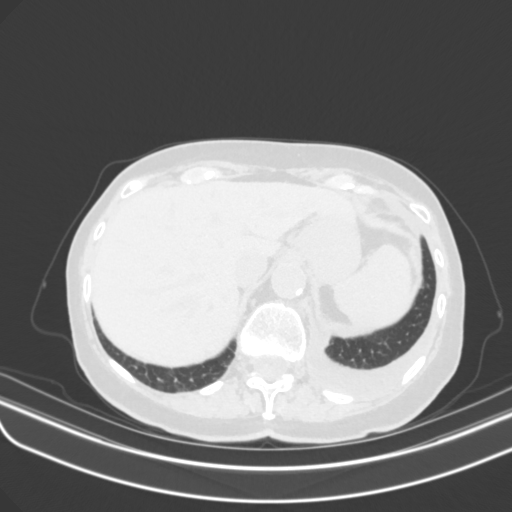
[im 58/187  lung]
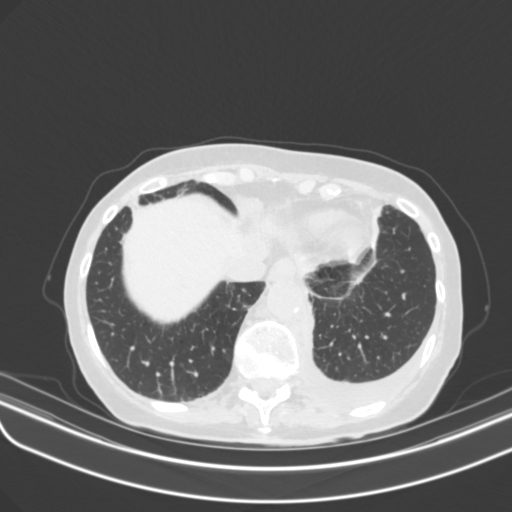
[im 72/187  mediastinal]
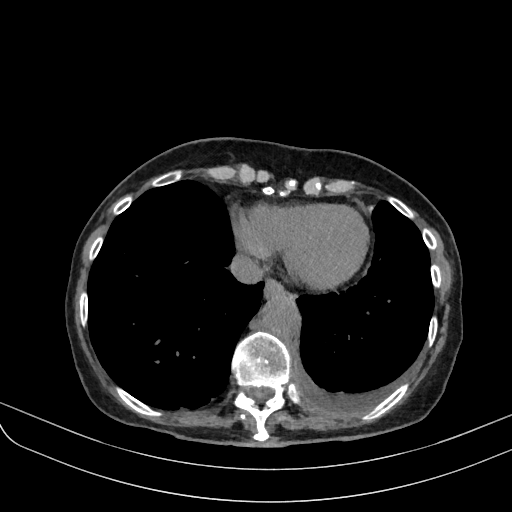
[im 72/187  lung]
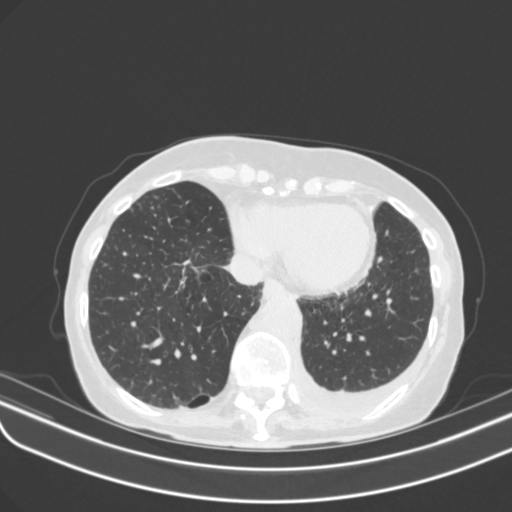
[im 86/187  lung]
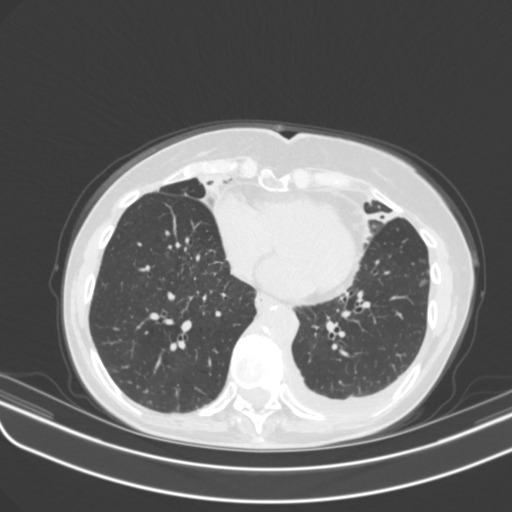
[im 101/187  lung]
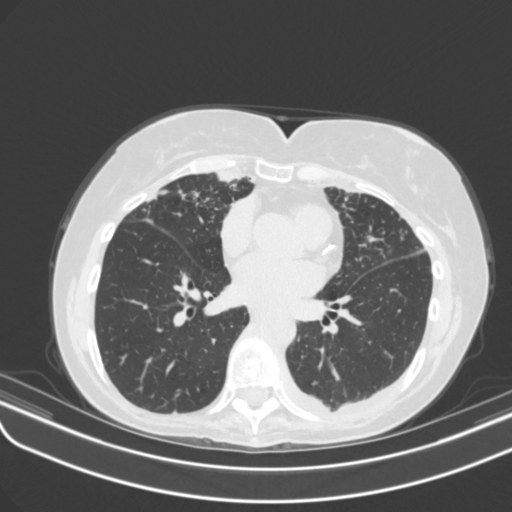
[im 115/187  lung]
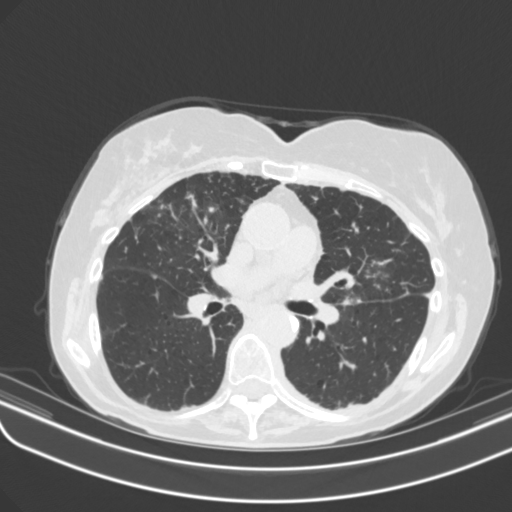
[im 129/187  mediastinal]
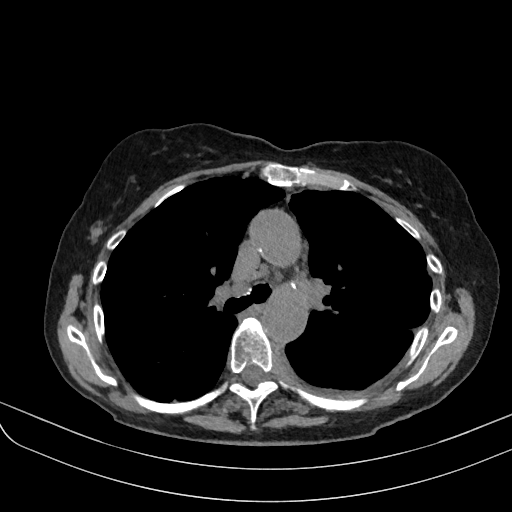
[im 129/187  lung]
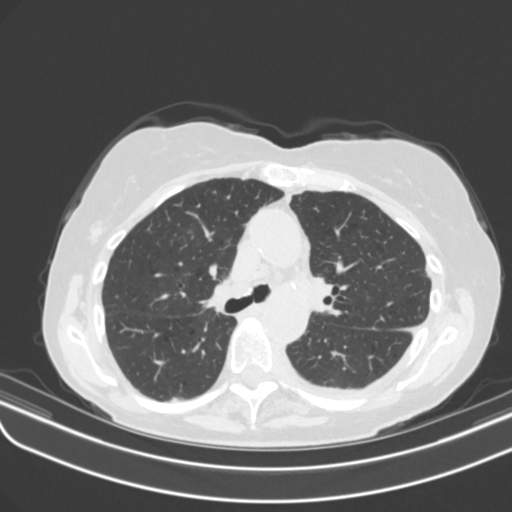
[im 144/187  lung]
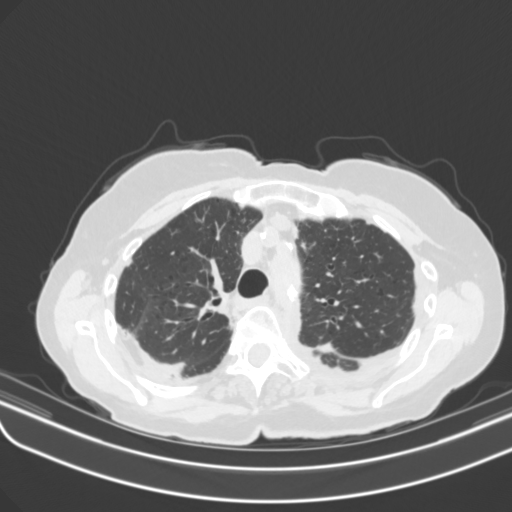
[im 158/187  lung]
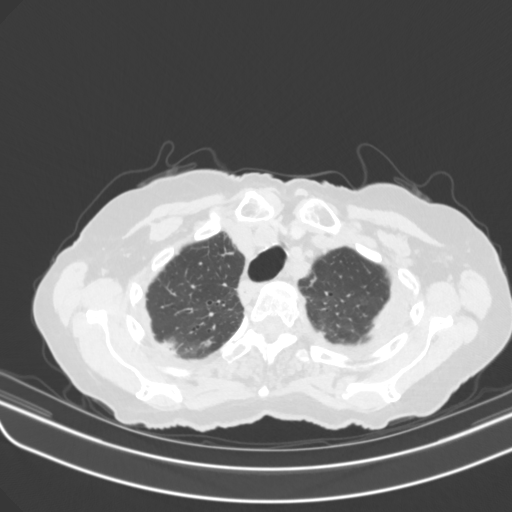
[im 172/187  lung]
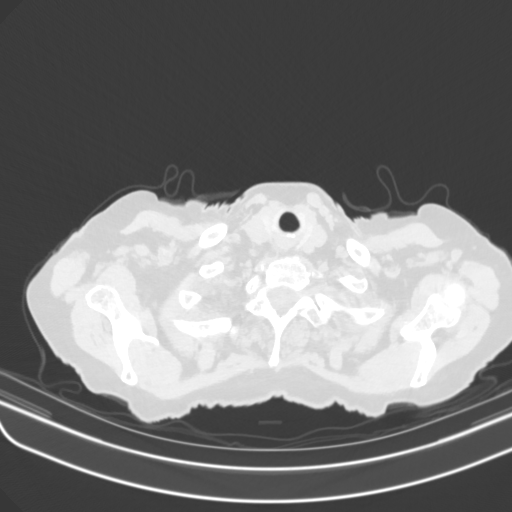

[Series 5: coronal · coronal · 0.73mm/px · 3 of 109 slices shown]
[im 22/109  lung]
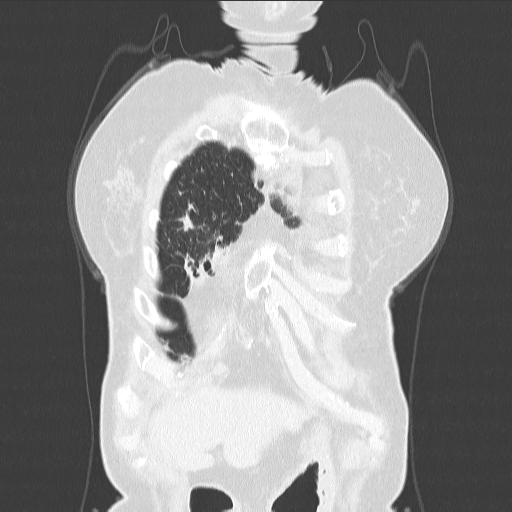
[im 44/109  lung]
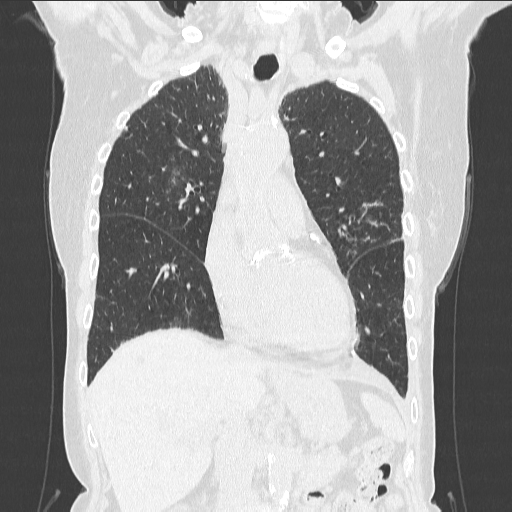
[im 65/109  lung]
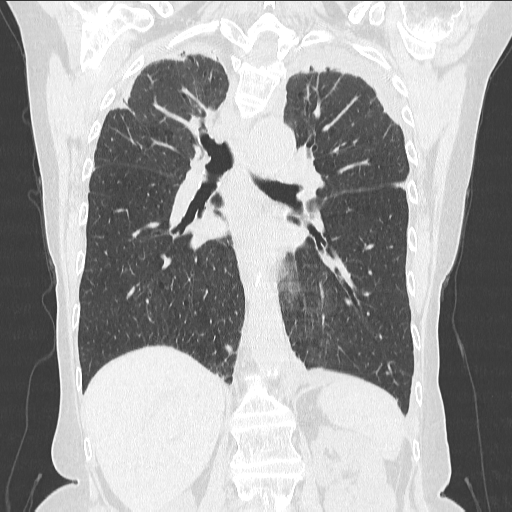

[15 of 36 positions shown; findings below may reference images not displayed]

FINDINGS: There is progression of the pleural disease within the left thorax.
Now seen in the left upper thorax and progressive within the left lower thorax
and medially within the left lower lobe. This has progressed since the prior
exam. I cannot exclude a neoplastic process. Nonspecific especially since some
of this was stated to have been seen on older exams dating all way back to
5299.
The pleural-based disease would be amenable to percutaneous biopsy. There are
emphysematous changes. There are reticular nodular infiltrates within the
right
middle lobe and lingula suggesting underlying bronchiolitis. This is
nonspecific
though can be seen in patients with atypical mycobacterial infection.
Atherosclerotic changes with marked coronary calcifications. Low-density lymph
nodes are suspected within the mediastinum which also appear progressed since
5963. The adrenal glands are normal in appearance.
IMPRESSION: Progressive pleural-based disease within the left thorax. This could be
followed
with a repeat PET scan or simply biopsied. There is question also developing
lymph nodes in the middle mediastinum on this noncontrast study.
Suspect inflammatory changes in the right middle lobe and lingula nonspecific
for bronchiolitis though can be seen in patients with atypical mycobacterial
infection.
Emphysematous changes, atherosclerotic changes and degenerative changes.
RADIATION DOSE REDUCTION: All CT scans are performed using radiation dose
reduction techniques, when applicable.  Technical factors are evaluated and
adjusted to ensure appropriate moderation of exposure.  Automated dose
management technology is applied to adjust the radiation doses to minimize
exposure while achieving diagnostic quality images.

## 2016-09-28 IMAGING — MG MAMMOGRAPHY SCREENING BILATERAL 3D TOMOSYNTHESIS WITH CAD
12 series · 12 of 28 positions shown · non-contrast
Comparison: Prior exams dating back to 02/20/2010.
BREAST DENSITY: (Level B) There are scattered areas of fibroglandular density.

MAMMOGRAPHY SCREENING BILATERAL 3D TOMOSYNTHESIS WITH CAD, 09/28/2016 [DATE]:
CLINICAL INDICATION: Screening.
TECHNIQUE: Digital bilateral mammograms and 3-D Tomosynthesis were obtained.
These were interpreted both primarily and with the aid of computer-aided
detection system.

[L CC synth-2D]
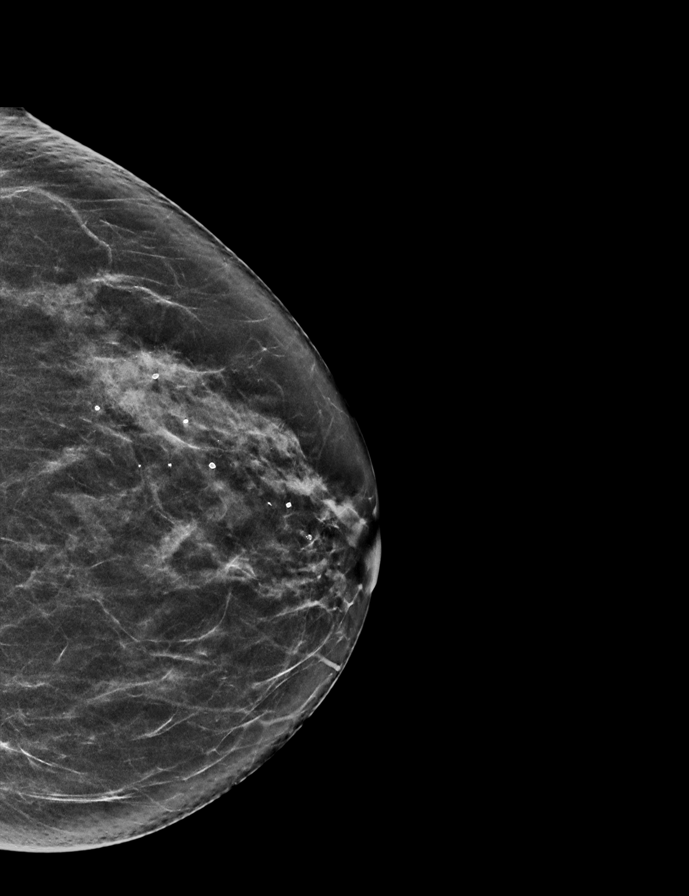

[L MLO synth-2D]
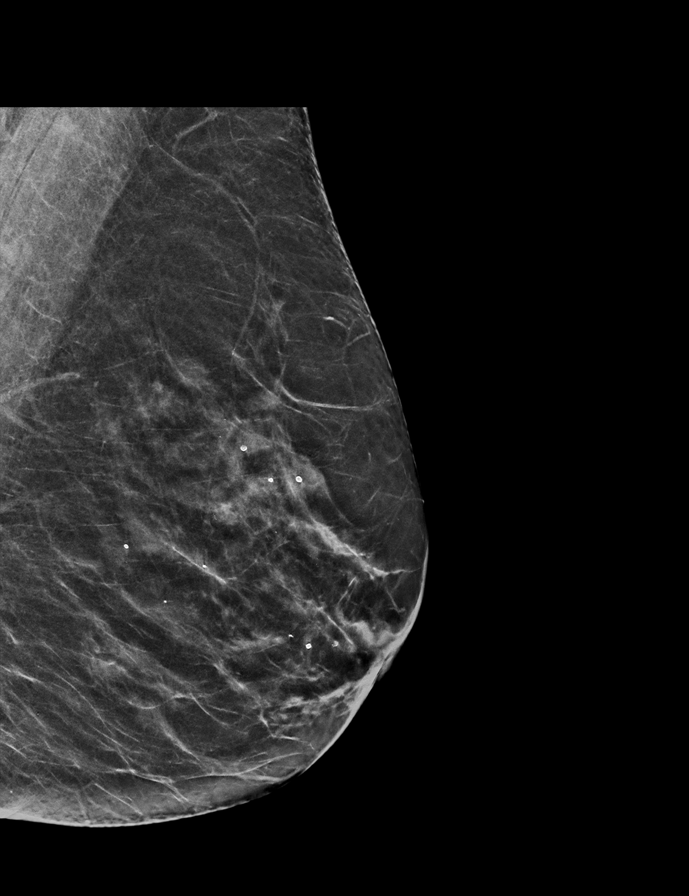

[R MLO synth-2D]
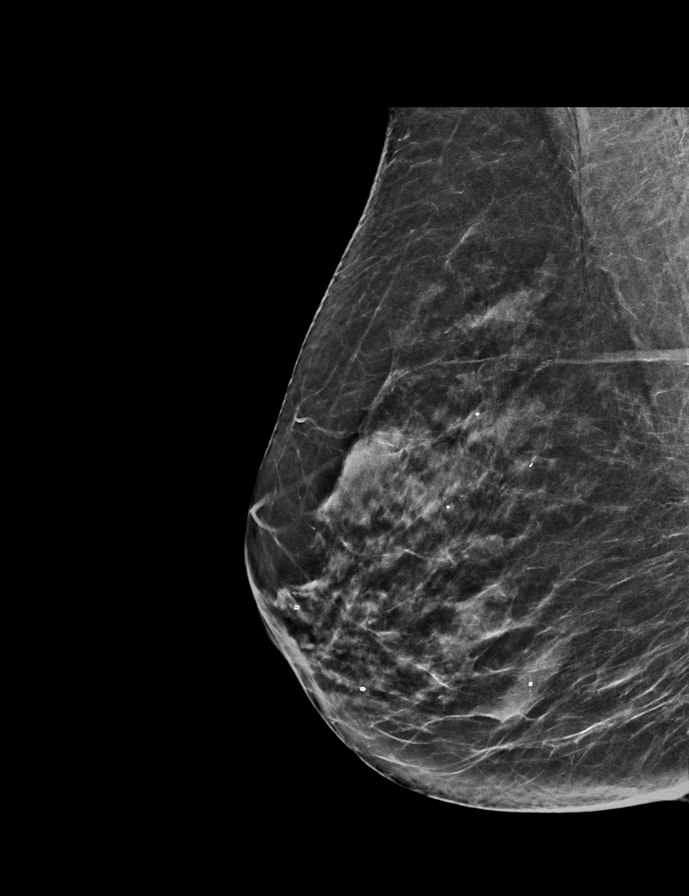

[R CC synth-2D]
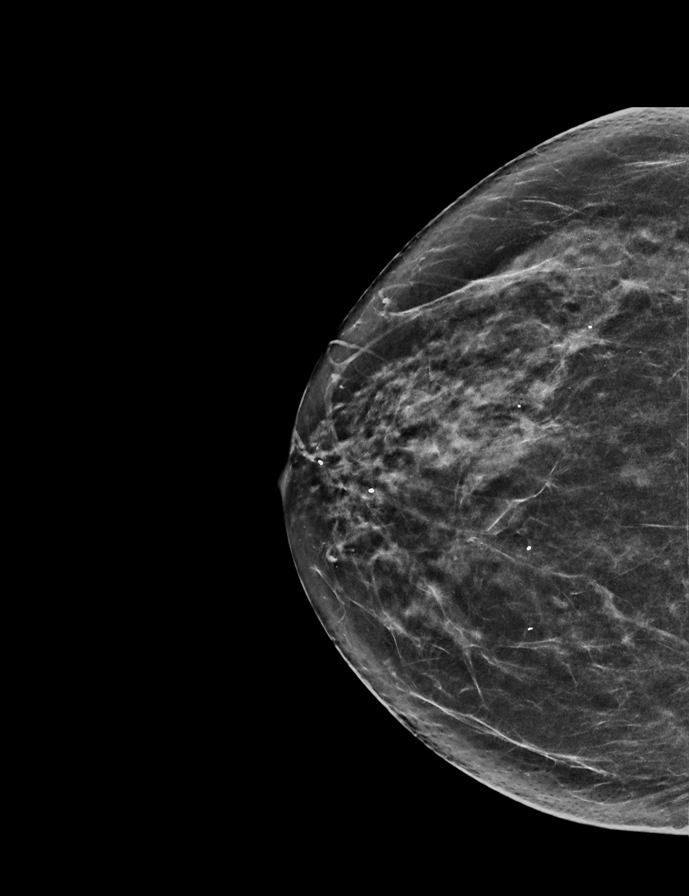

[L MLO tomo (1 of 2)]
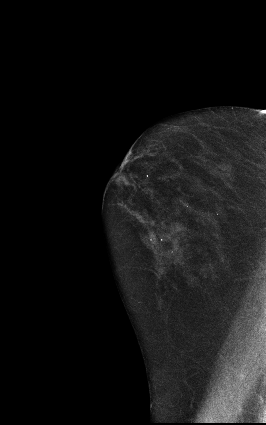

[R CC tomo (1 of 2)]
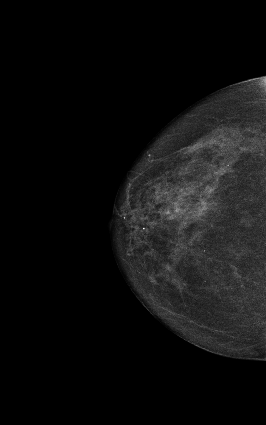

[R MLO tomo (1 of 2)]
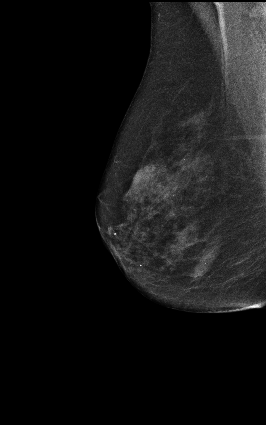

[L CC tomo (1 of 2)]
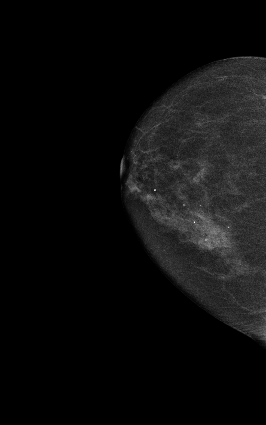

[L CC tomo (2 of 2) · tomo slice 29/58.0]
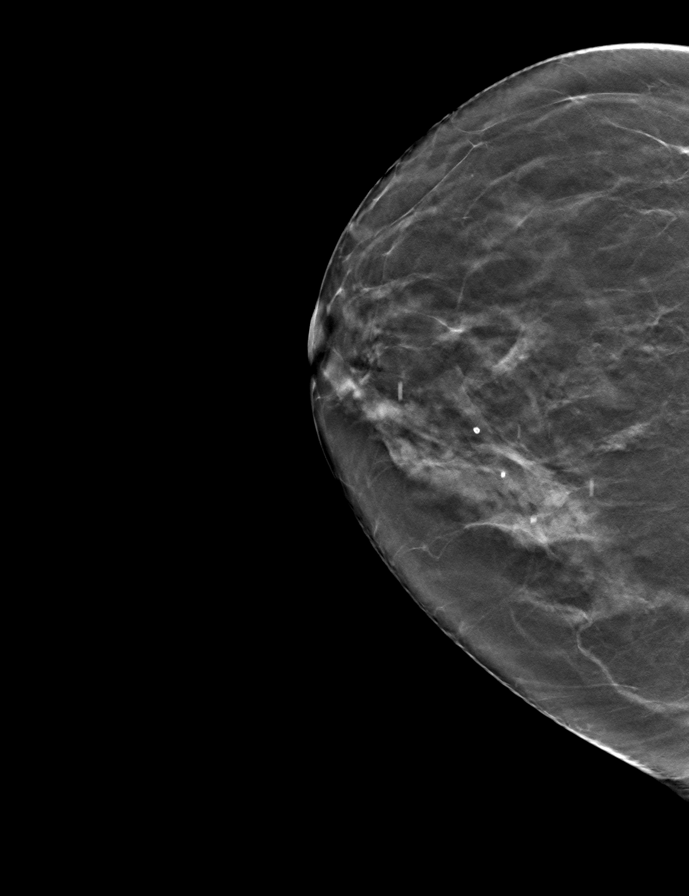

[R CC tomo (2 of 2) · tomo slice 29/58.0]
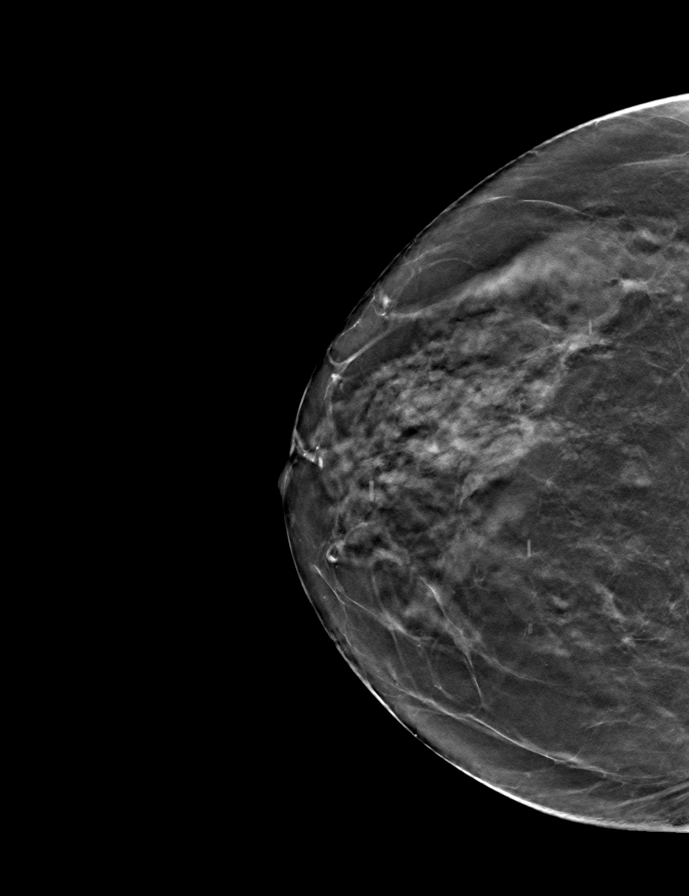

[L MLO tomo (2 of 2) · tomo slice 31/61.0]
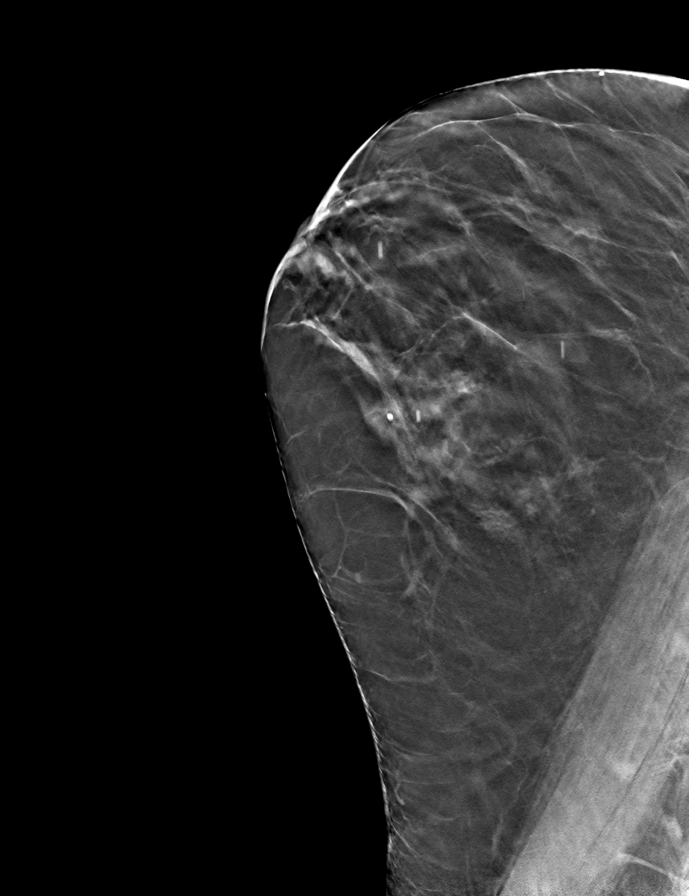

[R MLO tomo (2 of 2) · tomo slice 31/61.0]
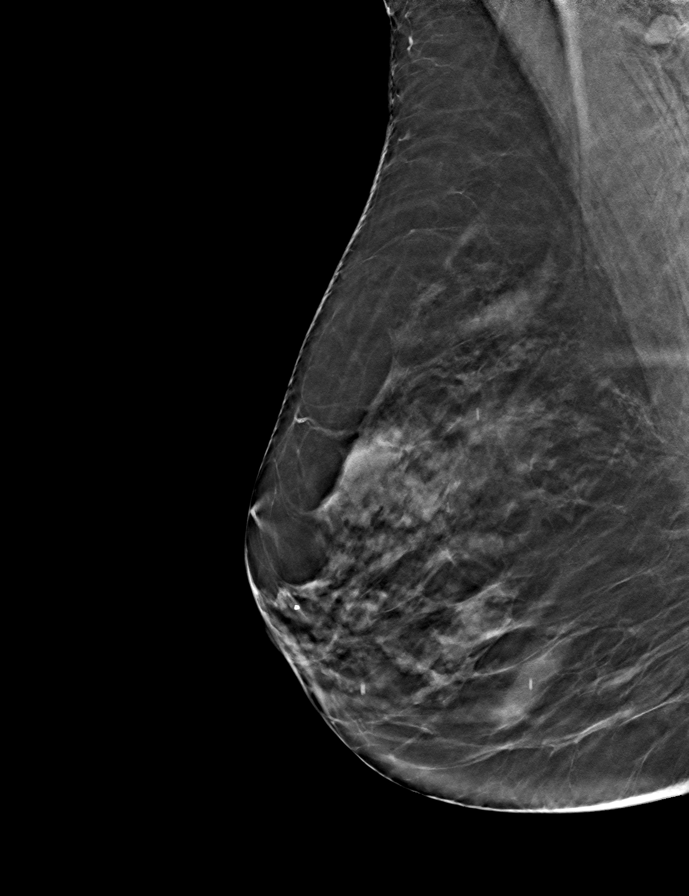

[12 of 28 positions shown; findings below may reference images not displayed]

FINDINGS: No mass, area of architectural distortion or suspicious
calcification. No mammographic evidence of malignancy.
IMPRESSION: ( BI-RADS 1) Negative mammogram. Routine mammographic follow-up is
recommended.

## 2017-03-28 IMAGING — CT CT CHEST WITHOUT CONTRAST
2 of 4 series · 15 of 36 positions shown, 18 images · non-contrast
Comparison: Comparison was made to the prior exam(s) within the last 12 
months 
dated  07/25/2016

CT CHEST WITHOUT CONTRAST, 03/28/2017 [DATE]: 
CLINICAL INDICATION: Pulmonary mycobacterial infection previous smoker. 
A search for DICOM formatted images was conducted for prior CT imaging studies 
completed at a non-affiliated media free facility.
TECHNIQUE: The chest was scanned from base of neck through the lung bases 
without contrast on a high resolution low dose CT scanner.  Routine MPR and 
MIP 
reconstructions were performed.

[Series 2: chest w/o 2.0 i31s 3 · axial · non-contrast · 0.74mm/px · z∈[-346,-54]mm · 12 of 174 slices shown, 15 images]
[im 14/174  mediastinal]
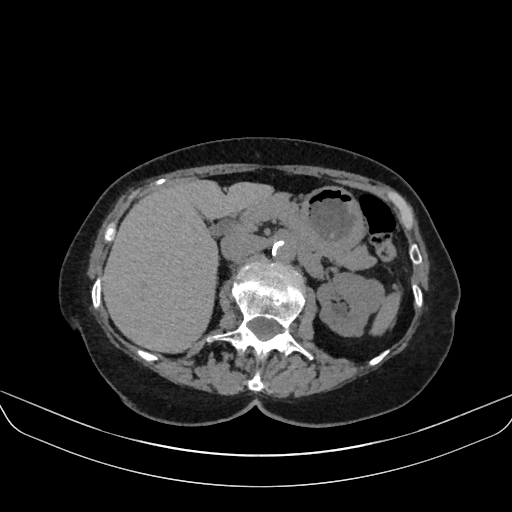
[im 14/174  lung]
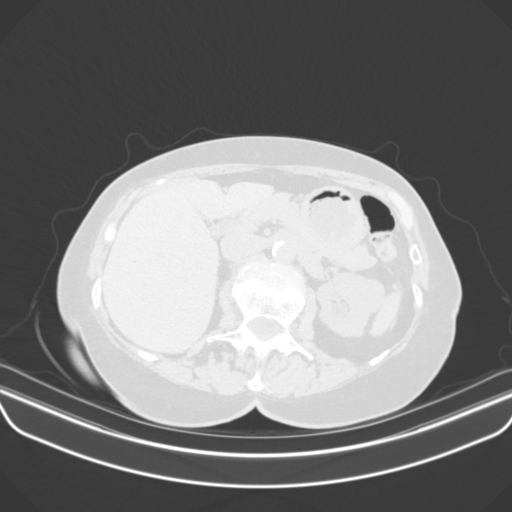
[im 27/174  lung]
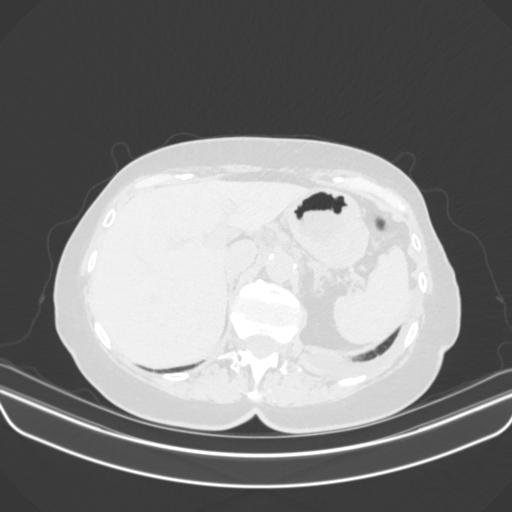
[im 40/174  lung]
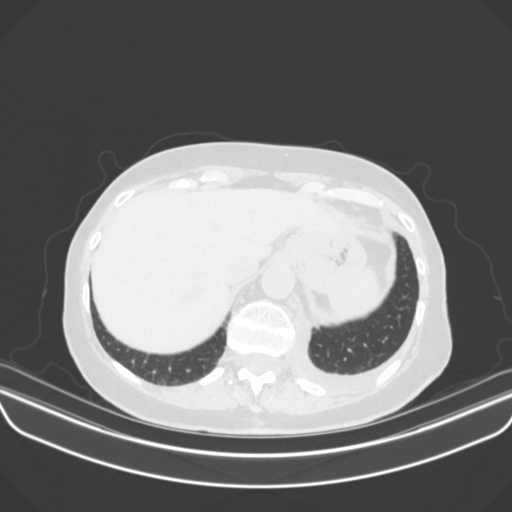
[im 54/174  lung]
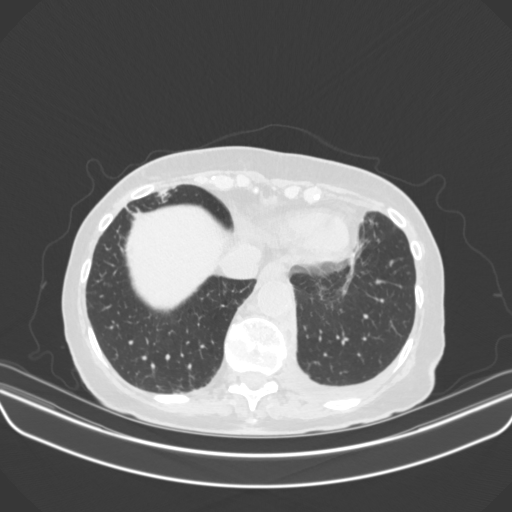
[im 67/174  mediastinal]
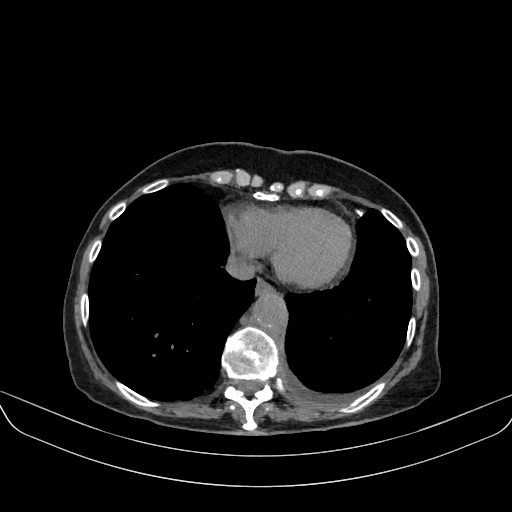
[im 67/174  lung]
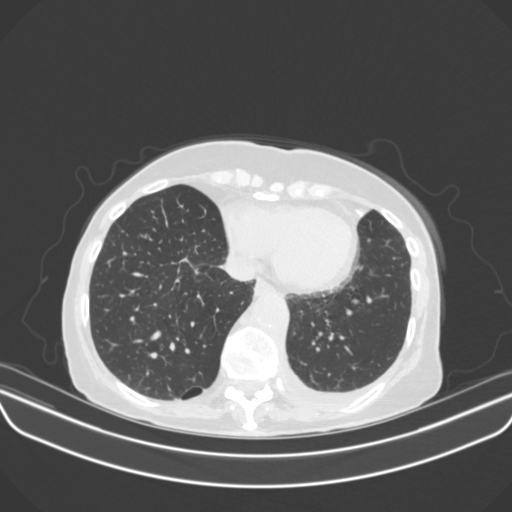
[im 80/174  lung]
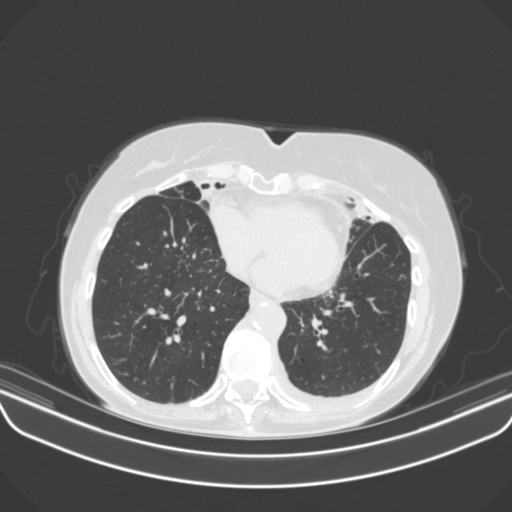
[im 94/174  lung]
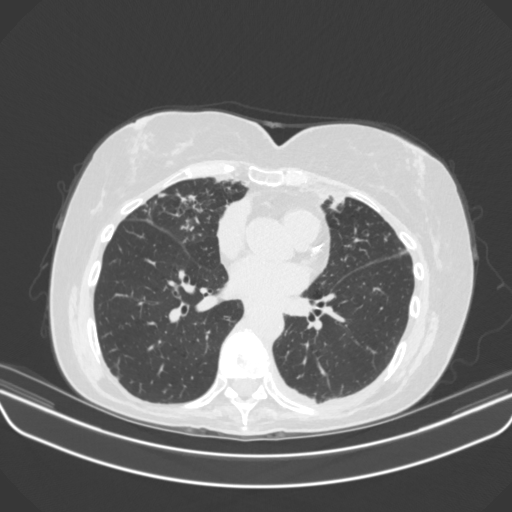
[im 107/174  lung]
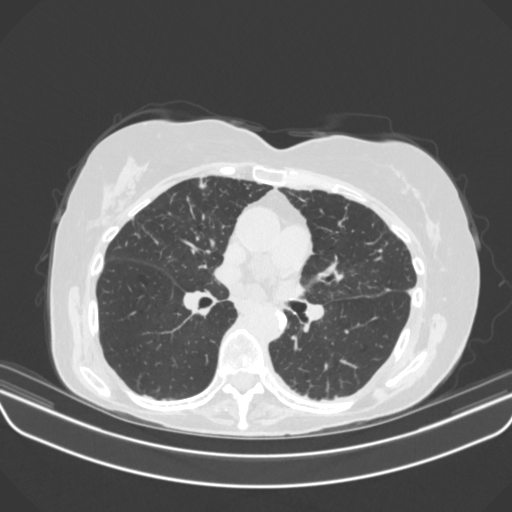
[im 120/174  mediastinal]
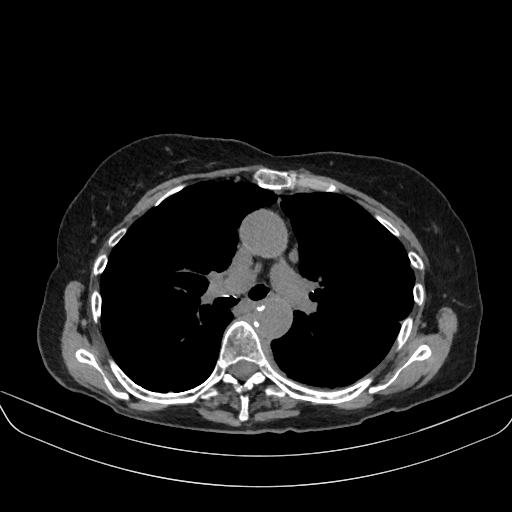
[im 120/174  lung]
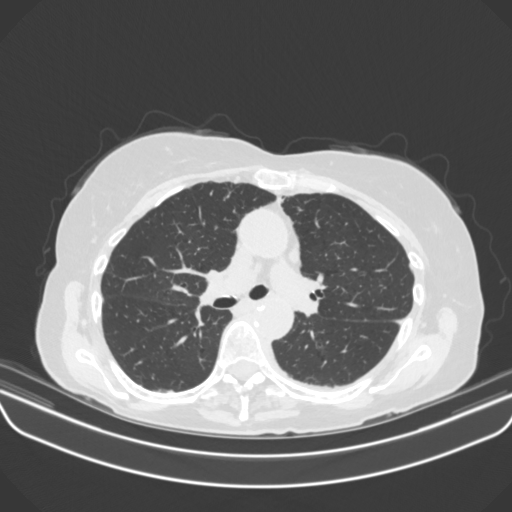
[im 134/174  lung]
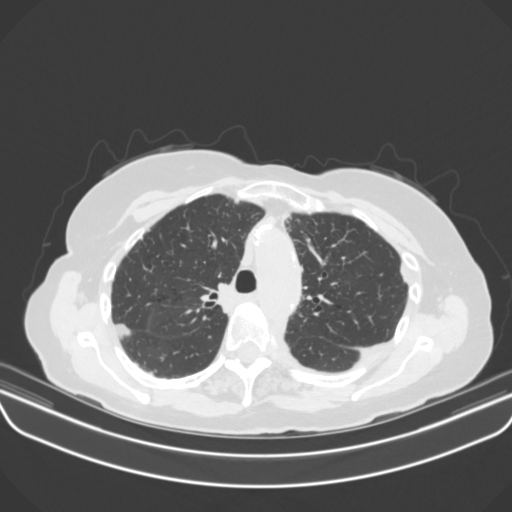
[im 147/174  lung]
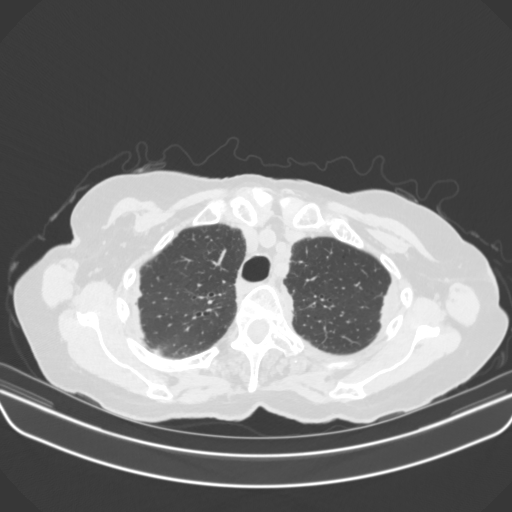
[im 160/174  lung]
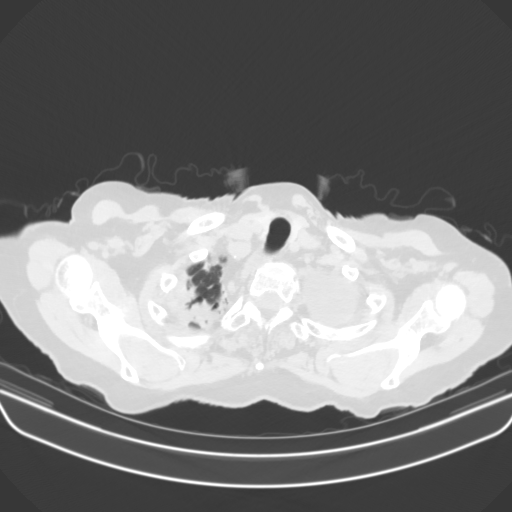

[Series 4: coronal · coronal · 0.68mm/px · 3 of 112 slices shown]
[im 23/112  lung]
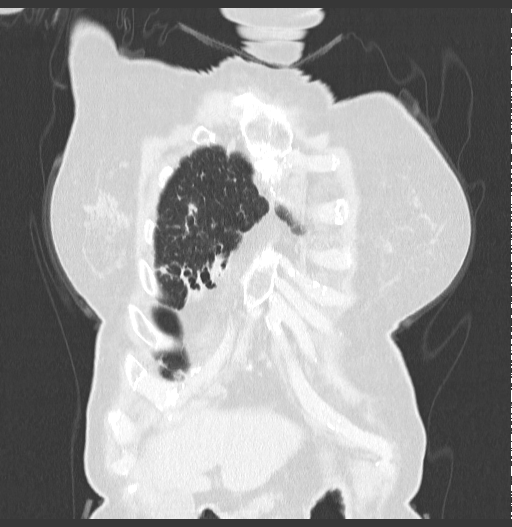
[im 45/112  lung]
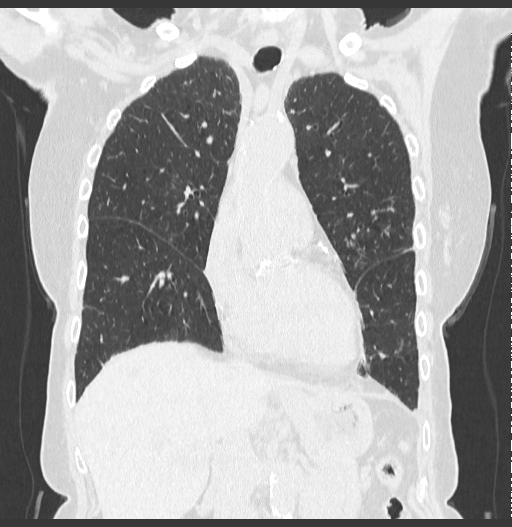
[im 67/112  lung]
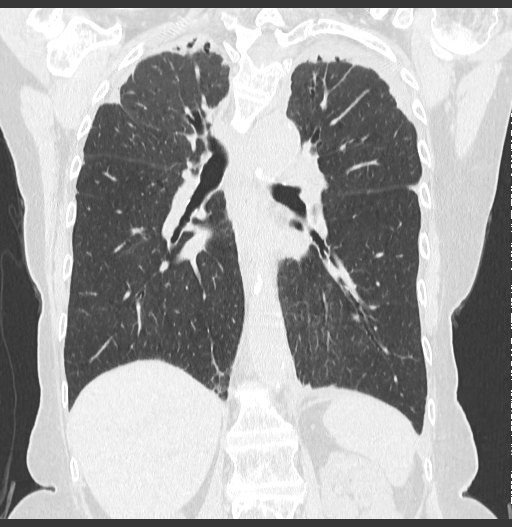

[15 of 36 positions shown; findings below may reference images not displayed]

FINDINGS: Pleural thickening is identified. Emphysematous changes. 
Atherosclerotic changes with moderate to marked coronary calcifications. 
Degenerative changes. Persistent small left pleural effusion. Reticular 
nodular 
changes are once again seen primarily within the region of the right middle 
lobe 
and lingula. Not significantly changed since the exam from 07/25/2016. No new 
mass or new adenopathy seen. No acute abnormality seen in the upper abdomen.
IMPRESSION: Stable CT appearance. 
RADIATION DOSE REDUCTION: All CT scans are performed using radiation dose 
reduction techniques, when applicable.  Technical factors are evaluated and 
adjusted to ensure appropriate moderation of exposure.  Automated dose 
management technology is applied to adjust the radiation doses to minimize 
exposure while achieving diagnostic quality images.

## 2017-10-03 IMAGING — MG MAMMOGRAPHY SCREENING BILATERAL 3D TOMOSYNTHESIS WITH CAD
4 of 12 series · 4 of 40 positions shown · non-contrast
Comparison: 09/28/2016 through 08/28/2013.

MAMMOGRAPHY SCREENING BILATERAL 3D TOMOSYNTHESIS WITH CAD, 10/03/2017 [DATE]: 
CLINICAL INDICATION: Screening.
TECHNIQUE: Digital bilateral mammograms and 3-D Tomosynthesis were obtained. 
These were interpreted both primarily and with the aid of computer-aided 
detection system.

[L CC synth-2D]
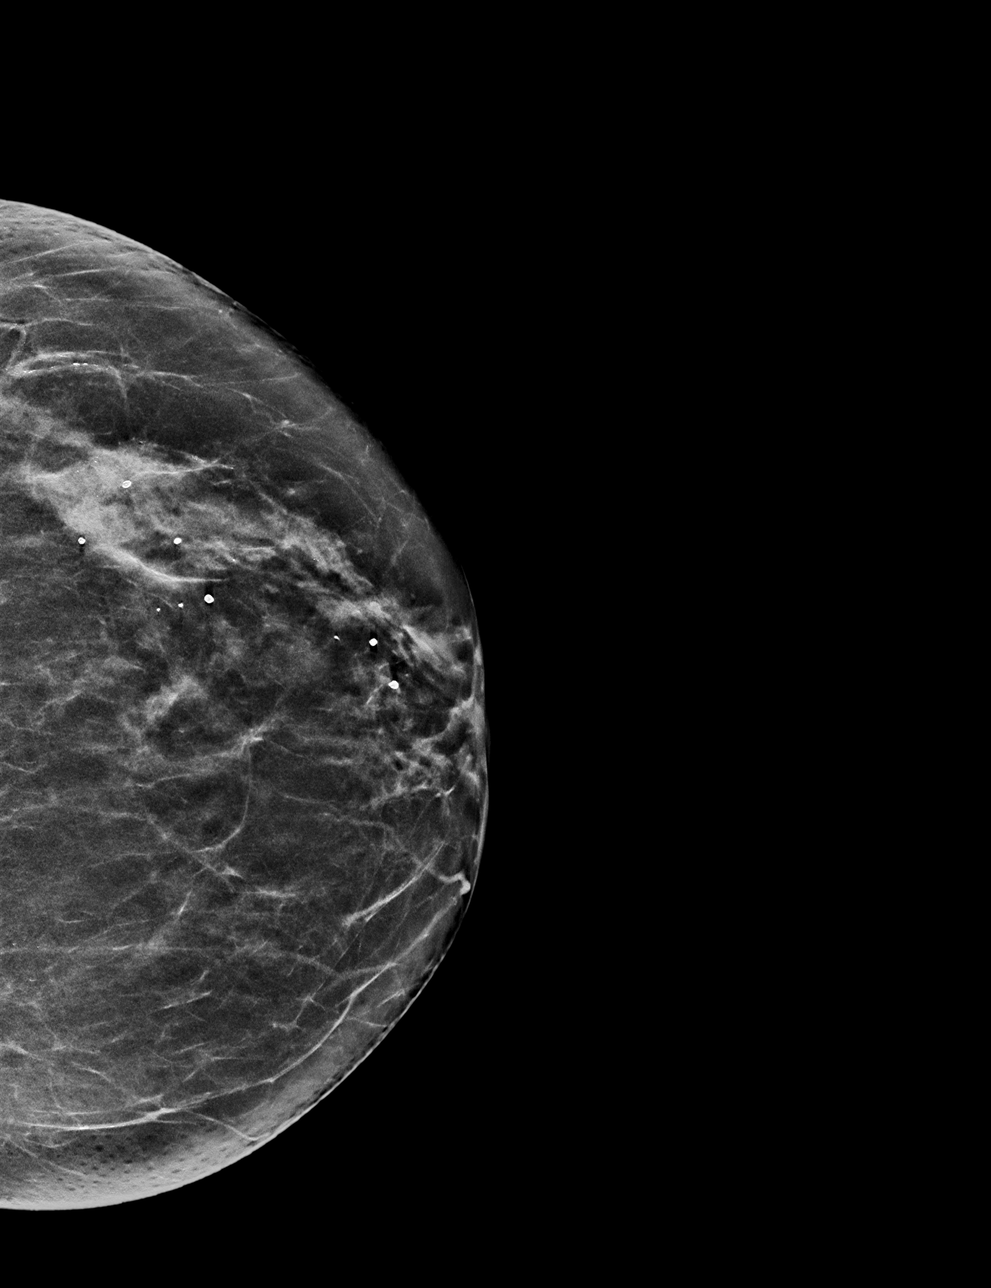

[R CC synth-2D]
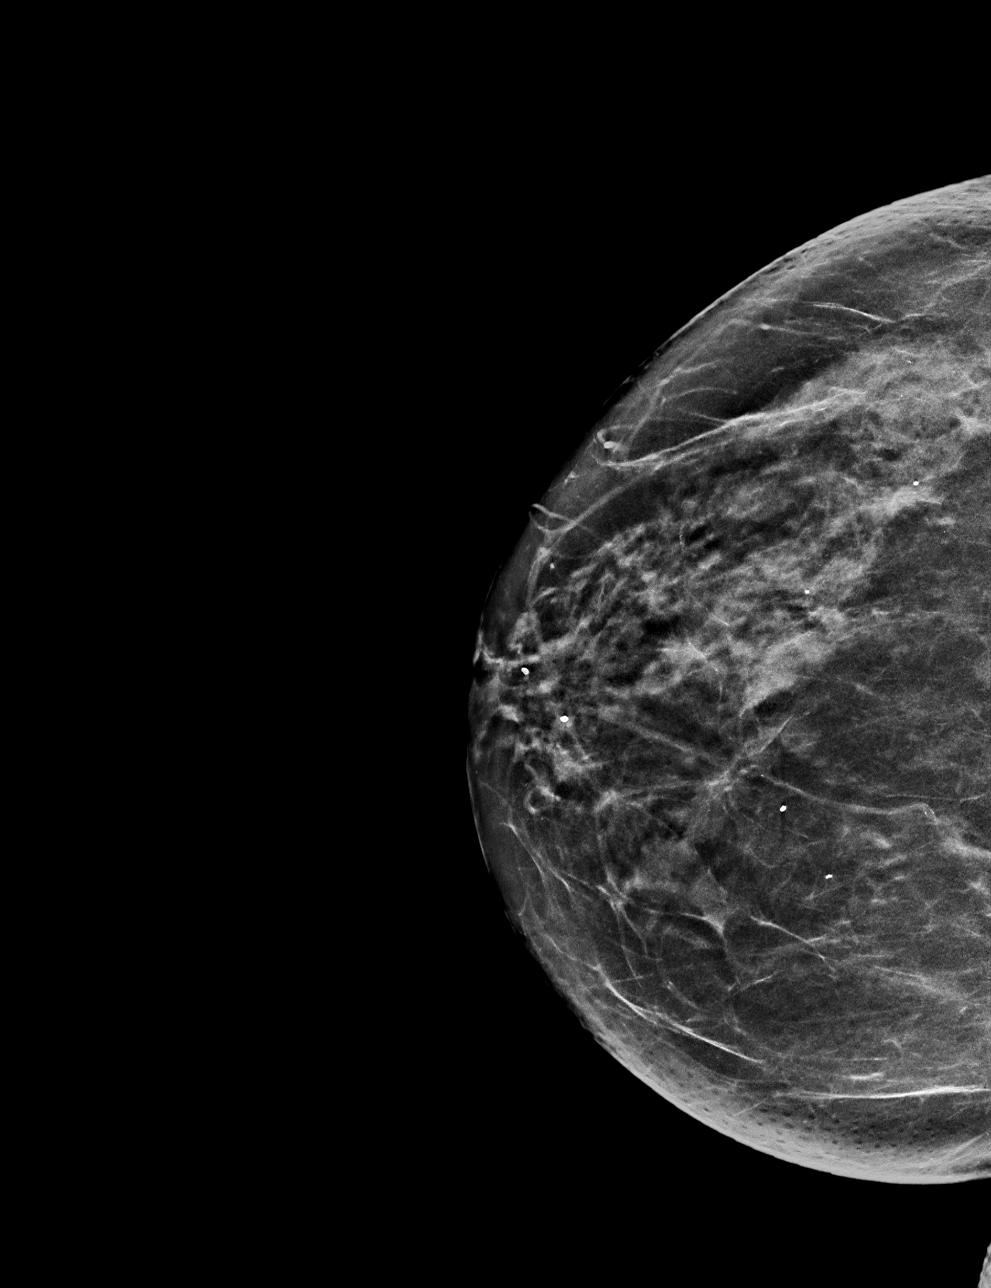

[R MLO synth-2D]
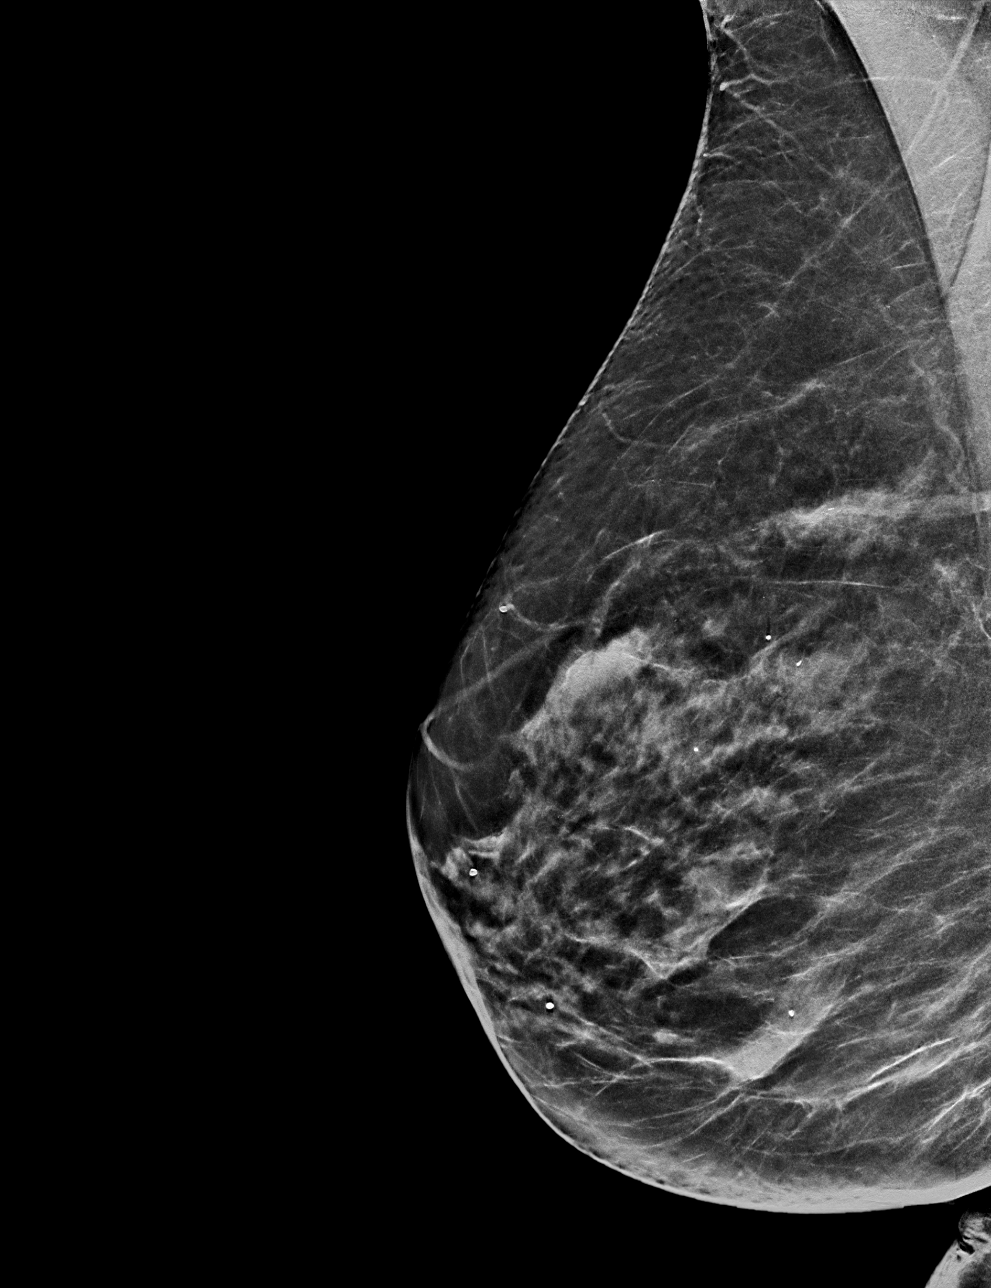

[L MLO synth-2D]
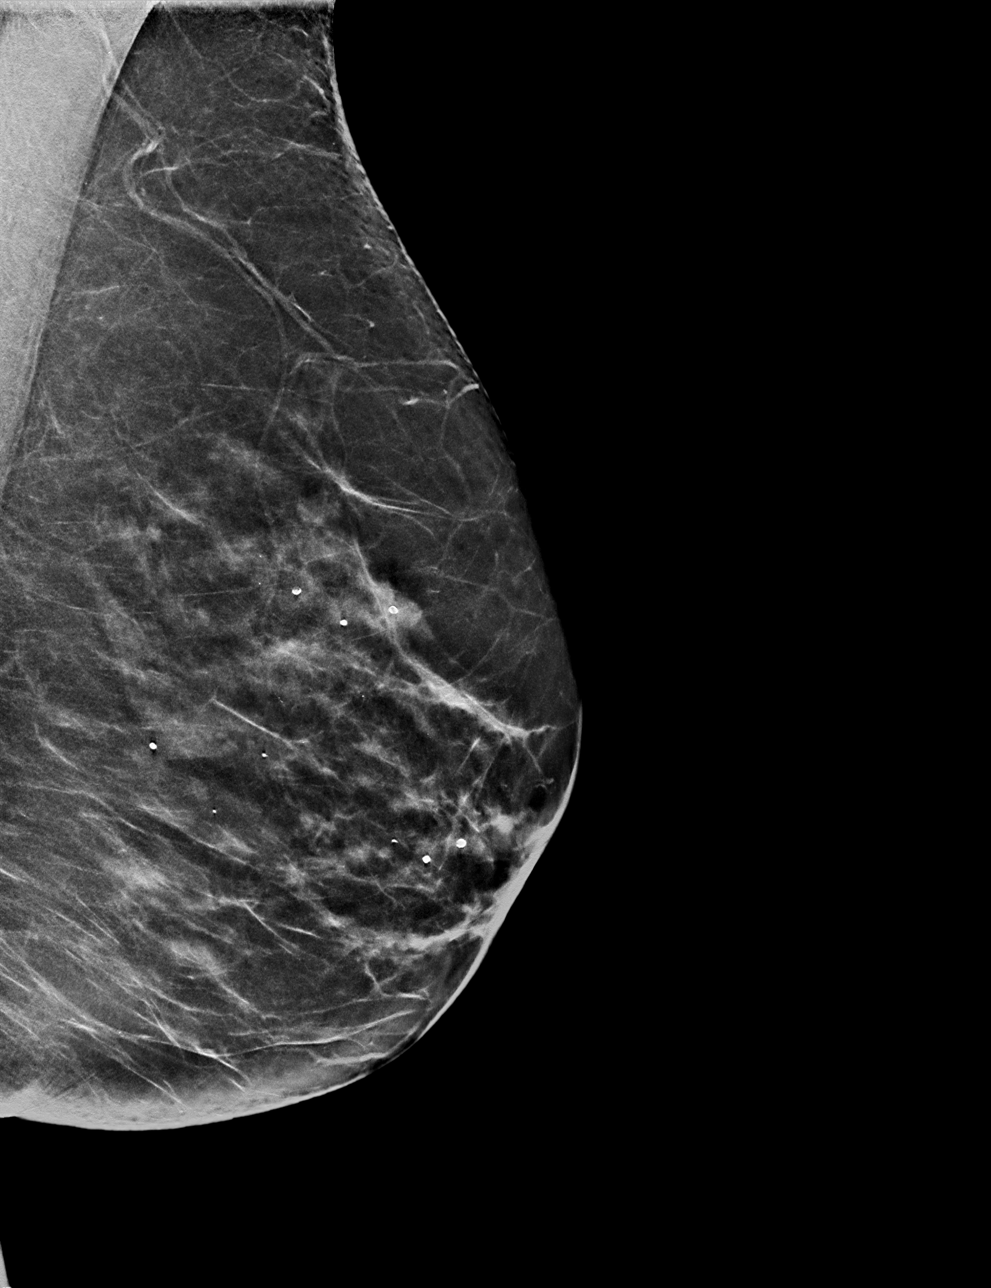

[4 of 40 positions shown; findings below may reference images not displayed]

BREAST DENSITY: (Level C) The breasts are heterogeneously dense, which may 
obscure small masses.
FINDINGS: No mammographically suspicious abnormality and no significant 
change.
IMPRESSION: ( BI-RADS 2) Benign findings. Routine mammographic follow-up is recommended.

## 2017-12-18 IMAGING — DX CHEST PA AND LATERAL
1 series · 2 of 2 positions shown · non-contrast
Comparison: none

CLINICAL INDICATION:  Persistent cough and shortness of breath for 3 months 
after several rounds of antibiotics. 
COMPARISON EXAMINATIONS: CT examination of the chest of 03/28/2017. No prior 
chest x-rays.

[Series 1: PA · U · 0.17mm/px · 2 of 2 slices shown]
[im 1/2]
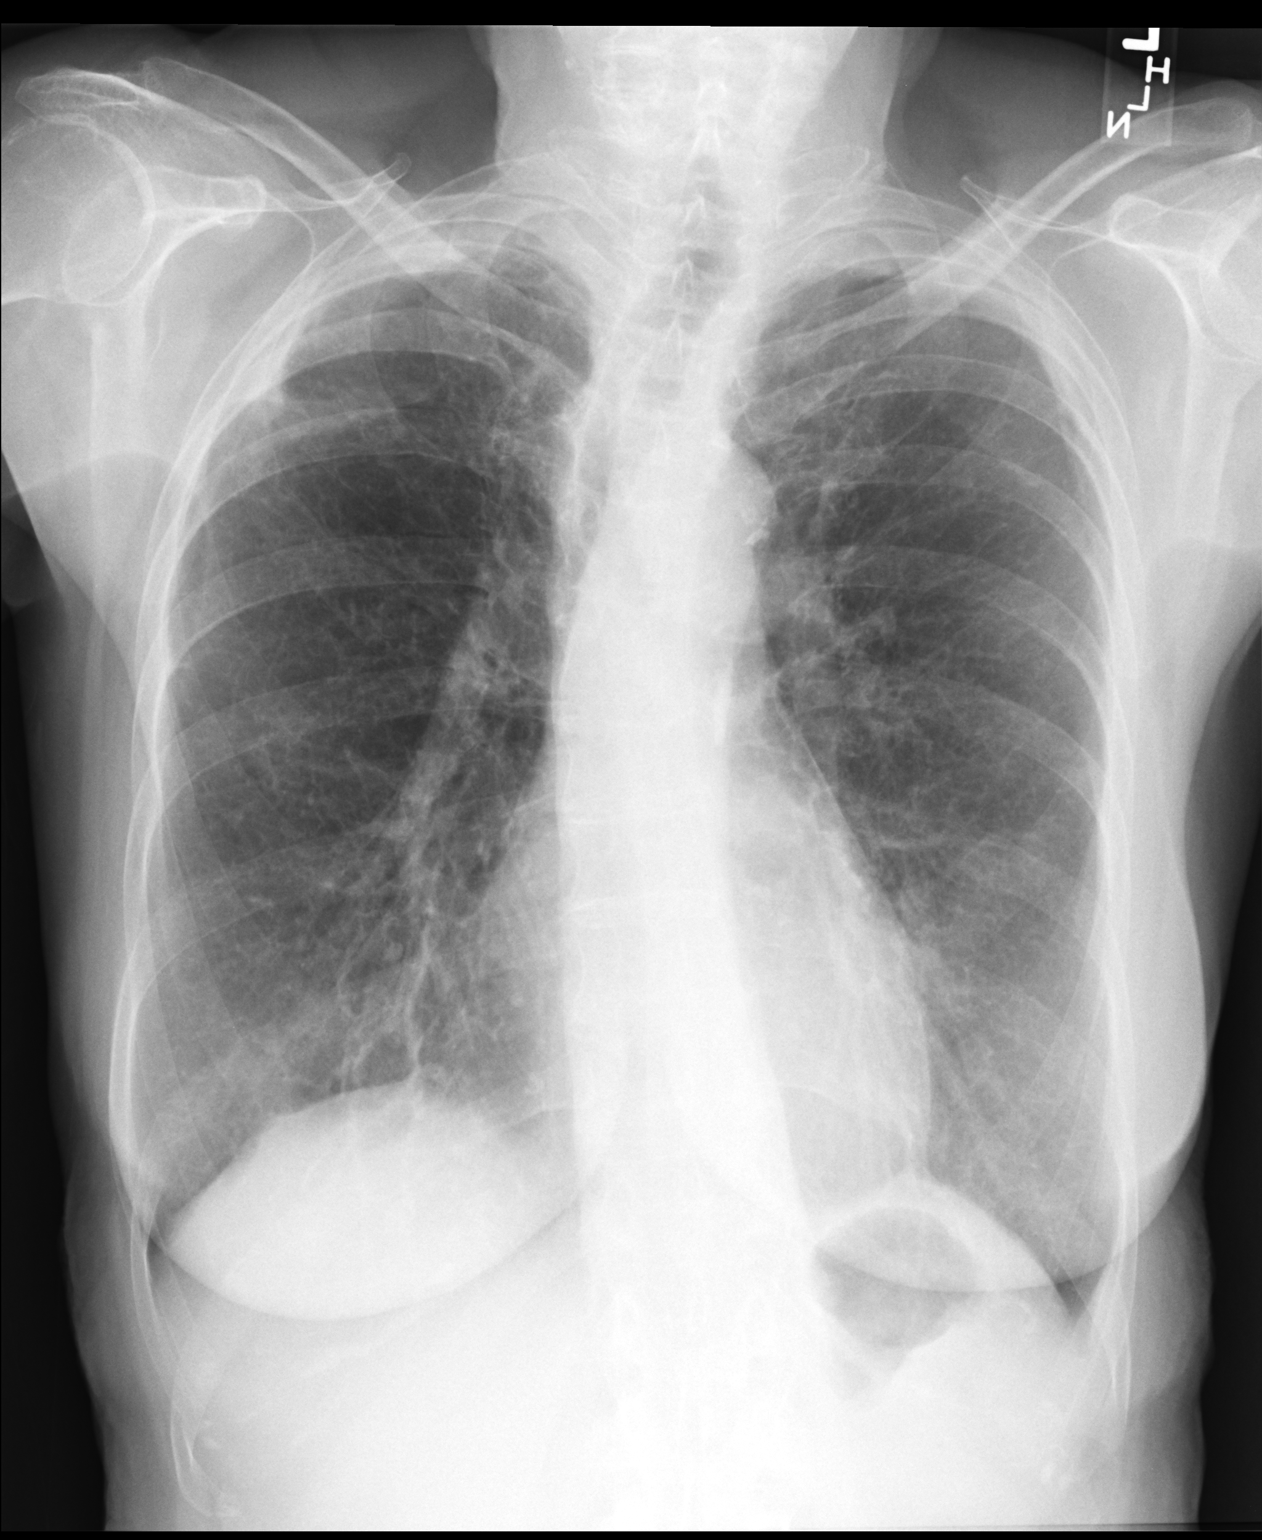
[im 2/2]
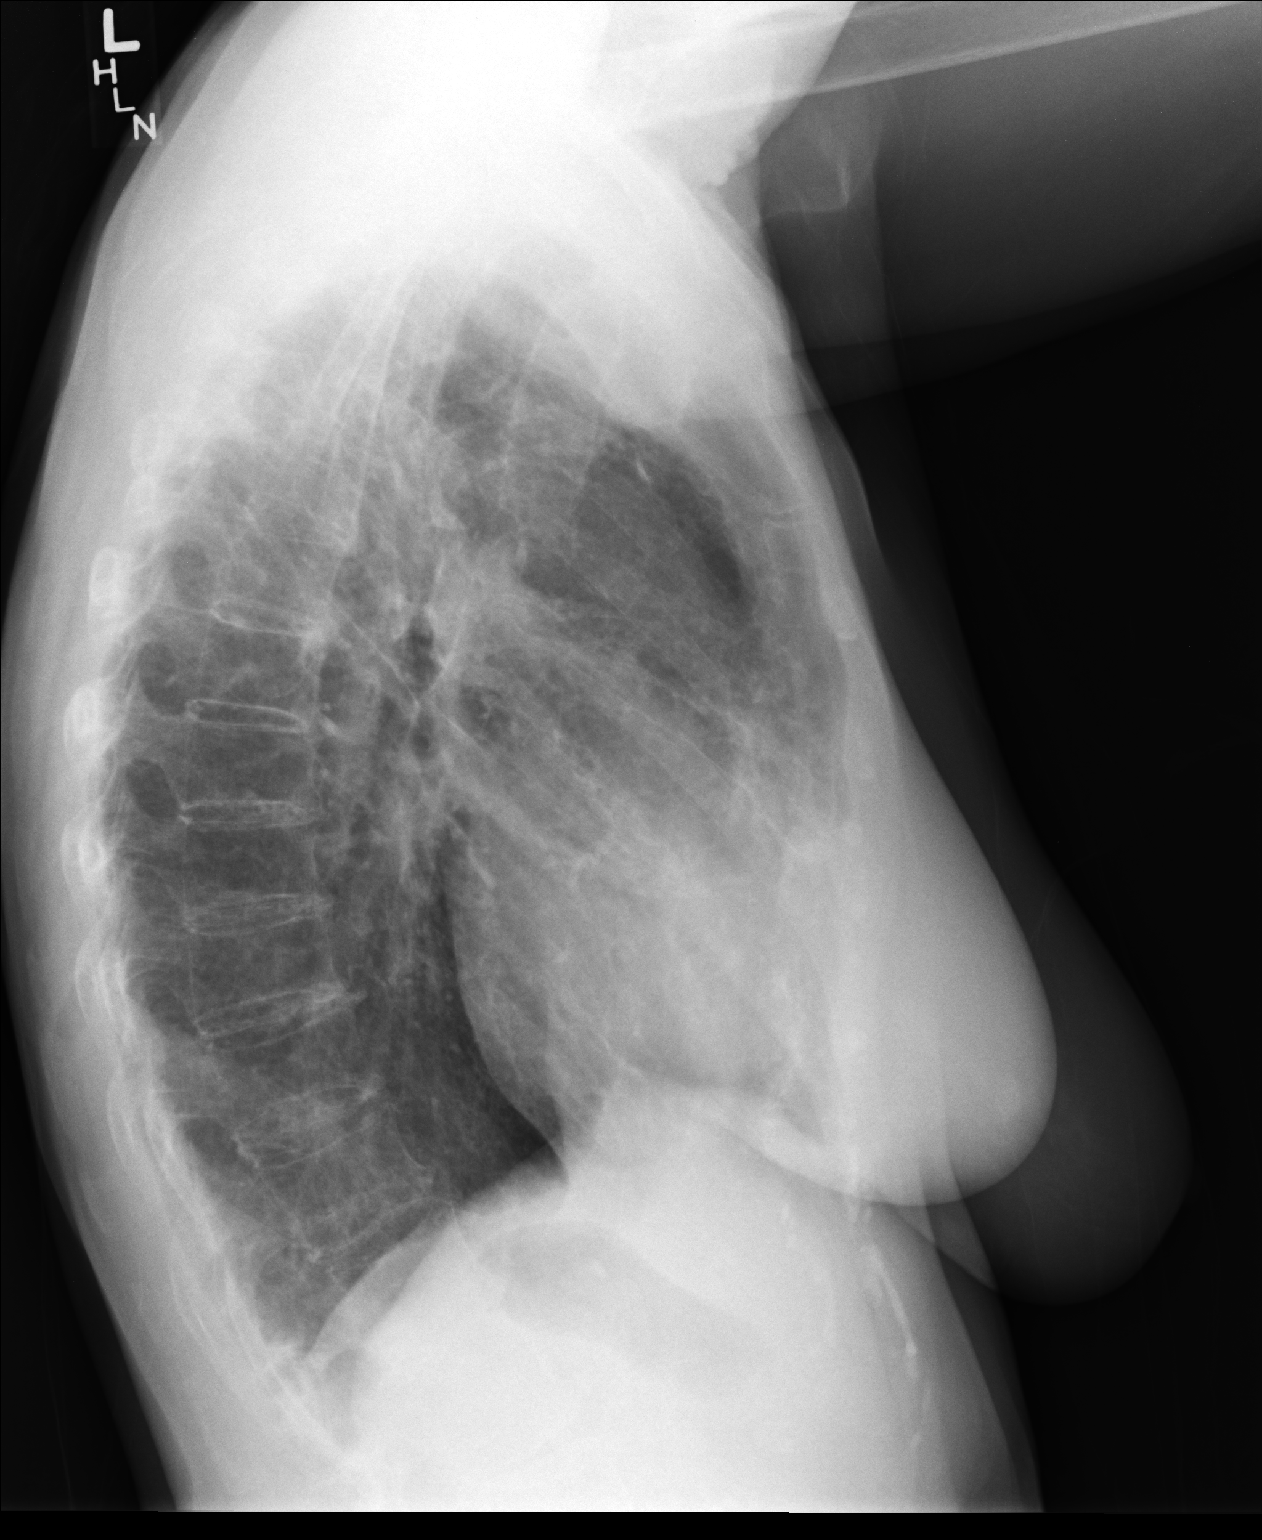

[2 of 2 positions shown; findings below may reference images not displayed]

FINDINGS: Hyperinflation. There is scattered lucency within the mid and upper 
lungs. Biapical pleural/parenchymal scarring, greater on the right. Mild 
reticulonodular interstitial changes are again seen within the mid to lower 
lungs. No focal superimposed consolidation. No effusion. No pneumothorax. 
Normal 
cardiac size and pulmonary vascularity.
IMPRESSION: Emphysema with mild reticulonodular changes within the mid to lower lungs. If 
symptoms persist, recommend CT exam.

## 2019-01-21 IMAGING — MG MAMMOGRAPHY SCREENING BILATERAL 3D TOMOSYNTHESIS WITH CAD
8 series · 9 of 24 positions shown · non-contrast
Comparison: Comparison was made to prior exams. 
BREAST DENSITY: (Level B) There are scattered areas of fibroglandular density.

MAMMOGRAPHY SCREENING BILATERAL 3D TOMOSYNTHESIS WITH CAD, 01/21/2019 [DATE]: 
CLINICAL INDICATION: Screening exam.
TECHNIQUE: Digital bilateral mammograms and 3-D Tomosynthesis were obtained. 
These were interpreted both primarily and with the aid of computer-aided 
detection system.

[L MLO]
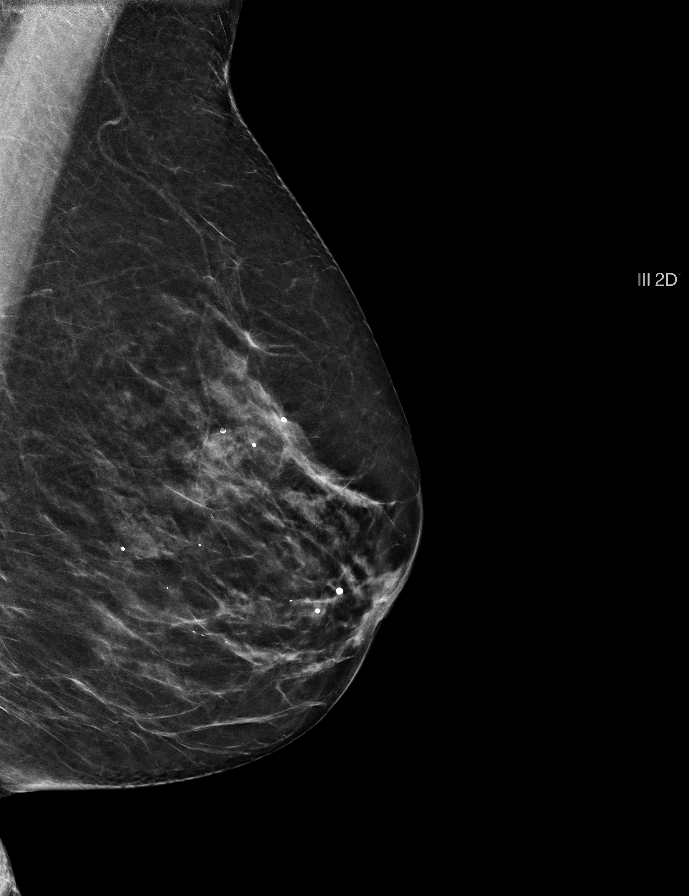

[R MLO]
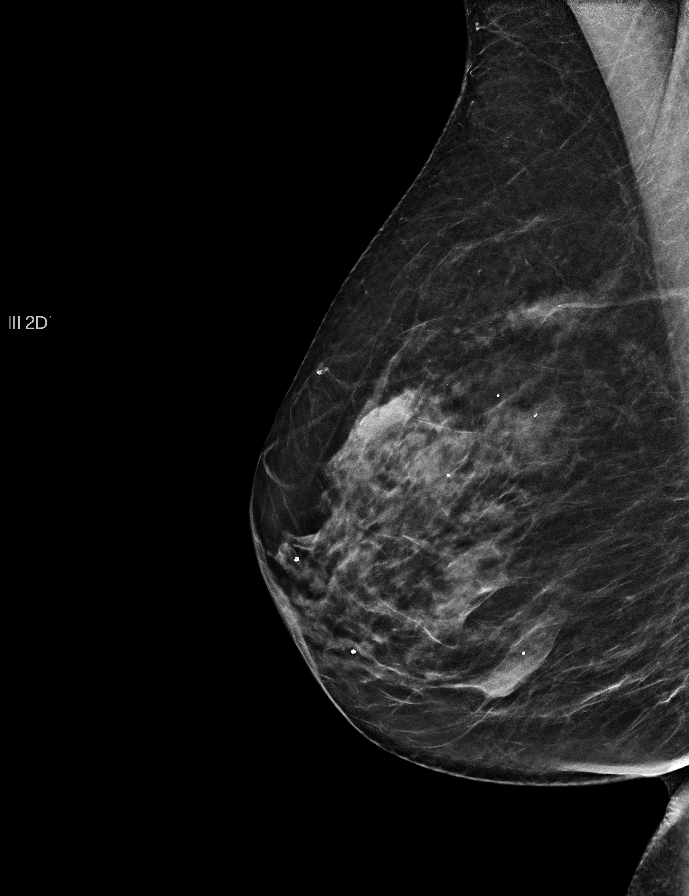

[L CC]
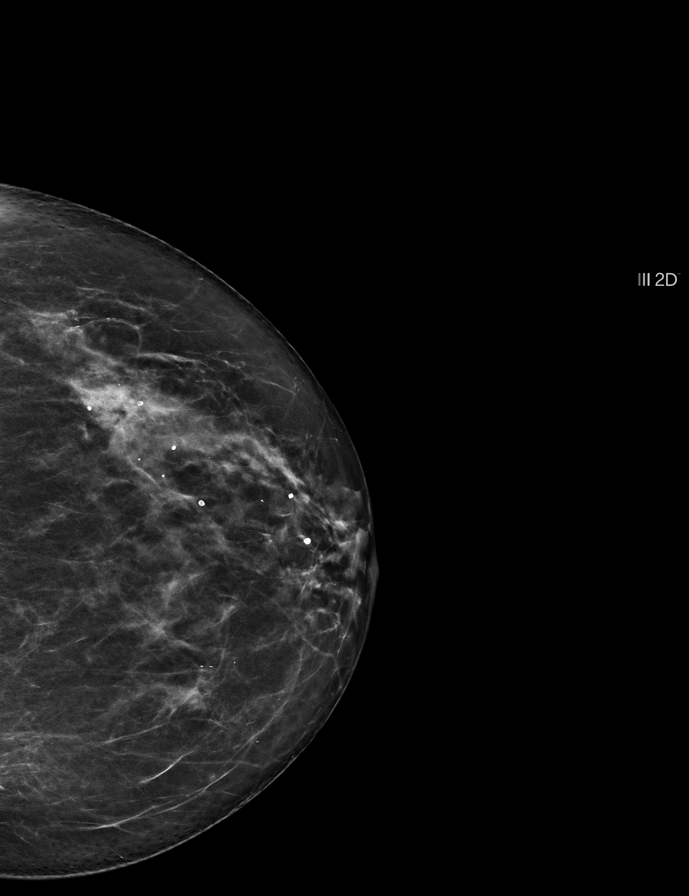

[R CC]
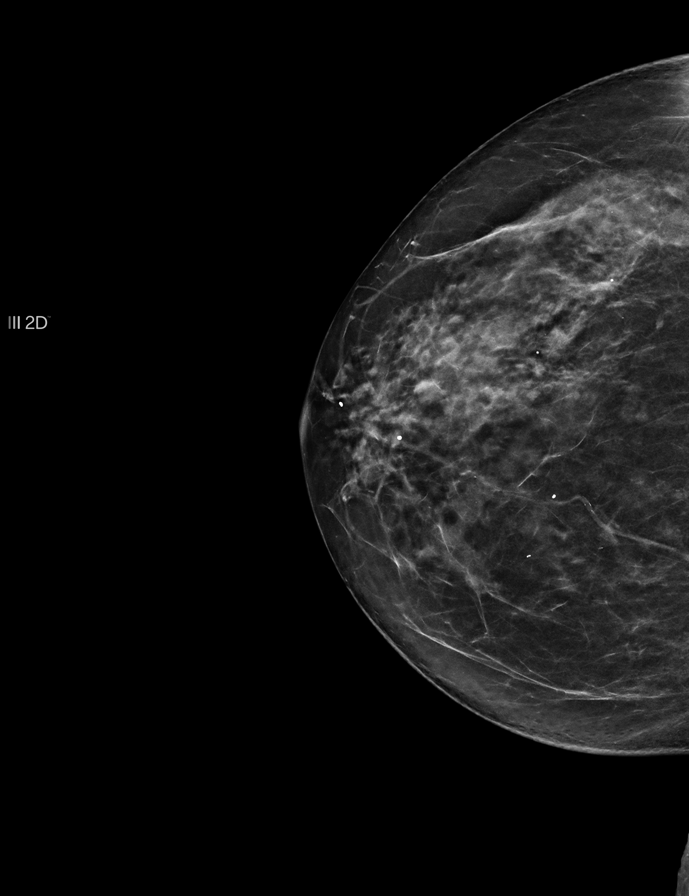

[R MLO tomo · 2 of 56 frames shown]
[frame 19/56]
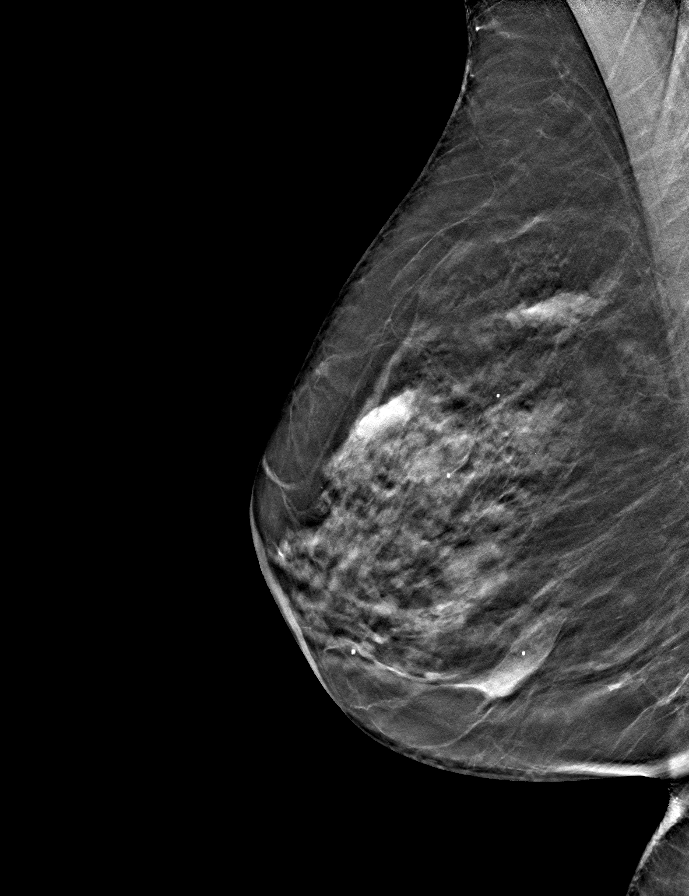
[frame 29/56]
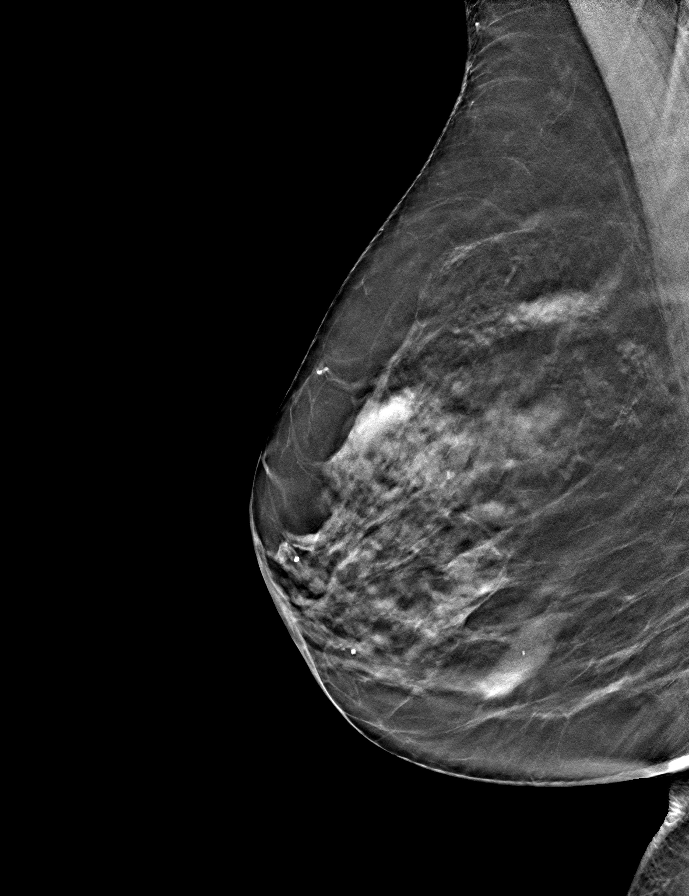

[R CC tomo · tomo slice 29/57.0]
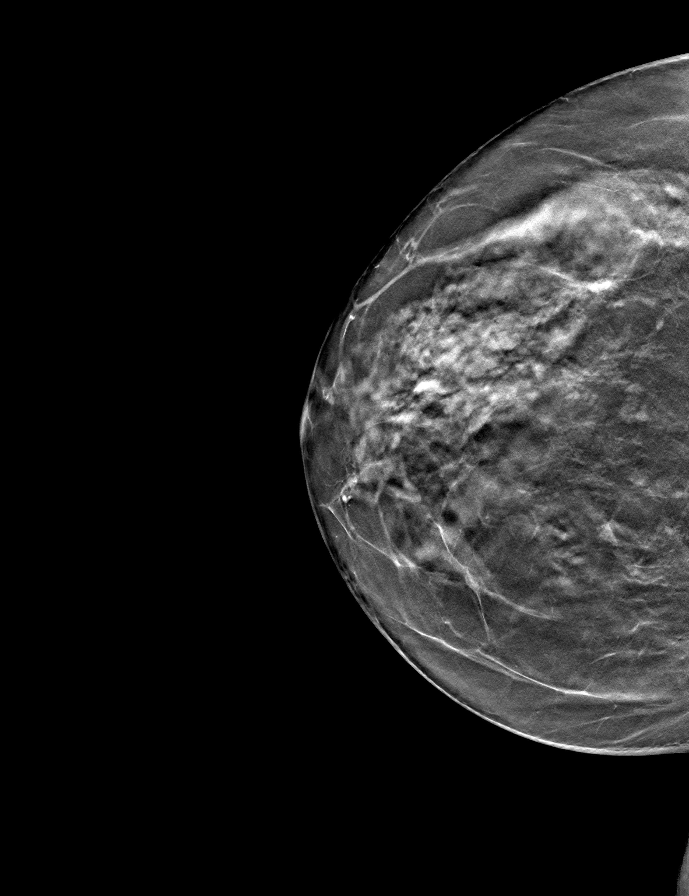

[L MLO tomo · tomo slice 29/58.0]
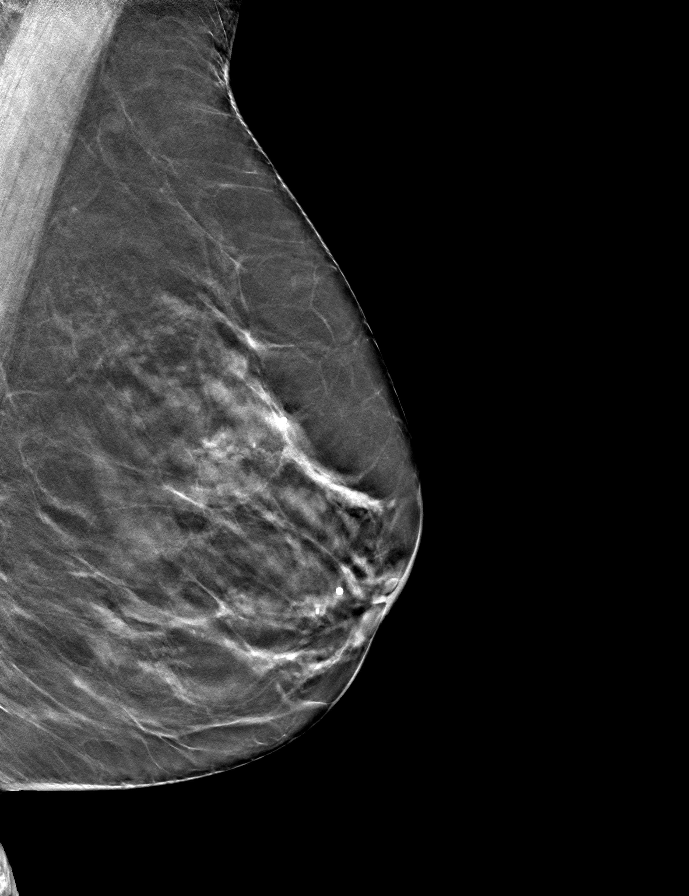

[L CC tomo · tomo slice 29/56.0]
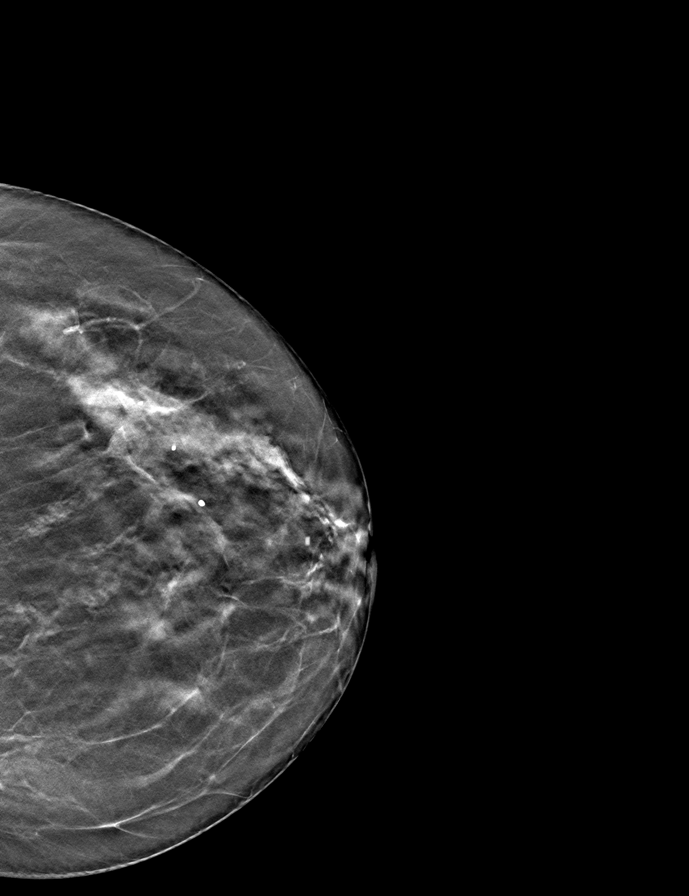

[9 of 24 positions shown; findings below may reference images not displayed]

FINDINGS: No suspicious mass, calcifications, or area of architectural 
distortion in either breast.
IMPRESSION: No mammographic evidence of malignancy in either breast. 
( BI-RADS 1) Negative mammogram. Routine mammographic follow-up is recommended.

## 2019-02-05 IMAGING — CT CT CHEST WITHOUT CONTRAST
2 of 4 series · 15 of 36 positions shown, 18 images · non-contrast
Comparison: There are no prior exam(s) available for comparison within the past 
12 months; however, comparison was made to the prior exam(s) dated  03/28/2017

CT CHEST WITHOUT CONTRAST, 02/05/2019 [DATE]: 
CLINICAL INDICATION: Hemoptysis noticed 3-4 months ago 
A search for DICOM formatted images was conducted for prior CT imaging studies 
completed at a non-affiliated media free facility.
TECHNIQUE: The chest was scanned from base of neck through the lung bases 
without contrast on a high resolution low dose CT scanner.  Routine MPR and MIP 
3D renderings were reconstructed on an independent workstation with concurrent 
physician supervision.

[Series 2: chest w/o 2.0 i31s 3 · axial · non-contrast · 0.71mm/px · z∈[-357,-65]mm · 12 of 174 slices shown, 15 images]
[im 14/174  mediastinal]
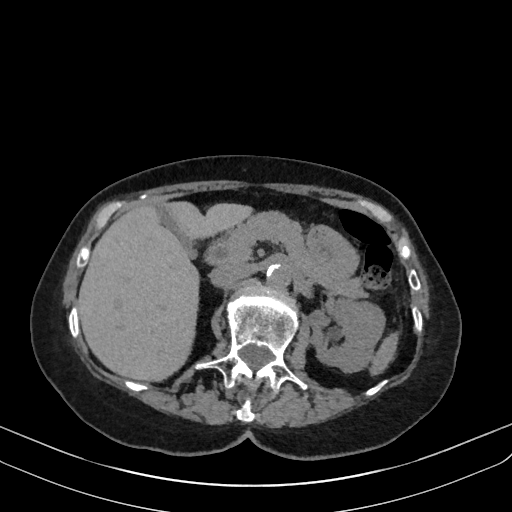
[im 14/174  lung]
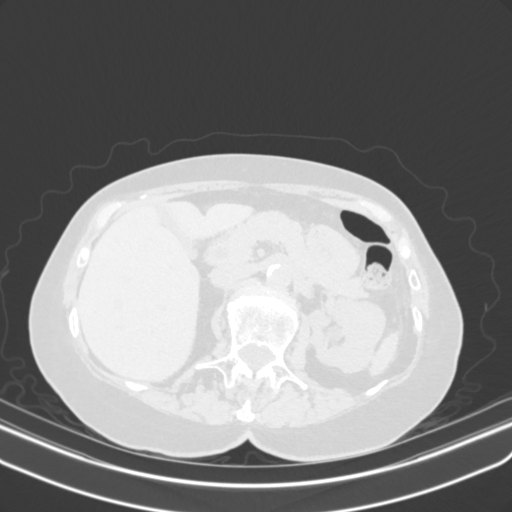
[im 27/174  lung]
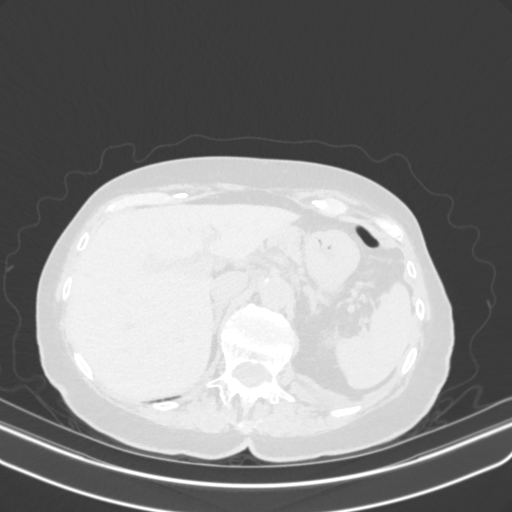
[im 40/174  lung]
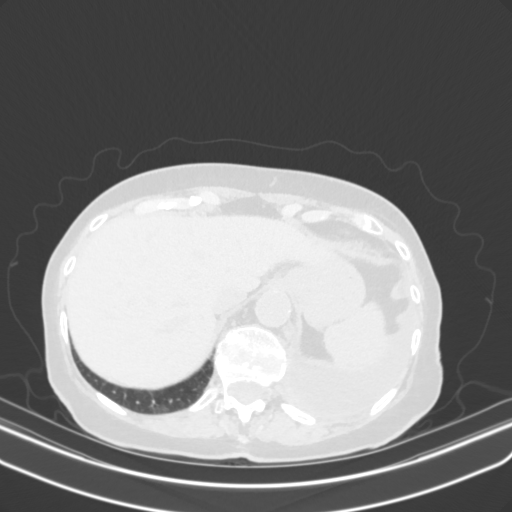
[im 54/174  lung]
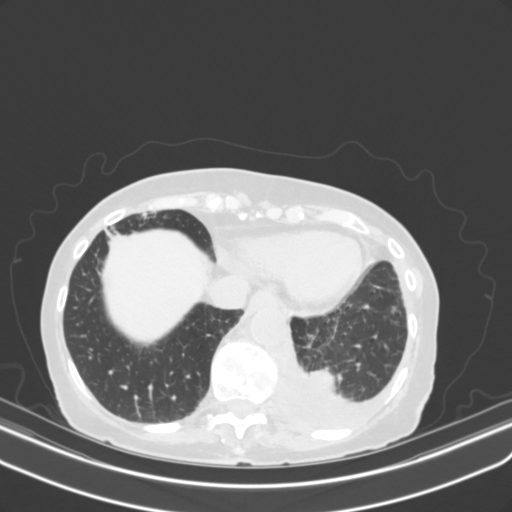
[im 67/174  mediastinal]
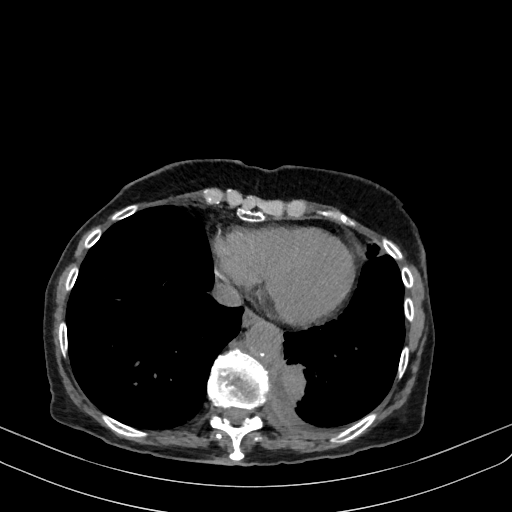
[im 67/174  lung]
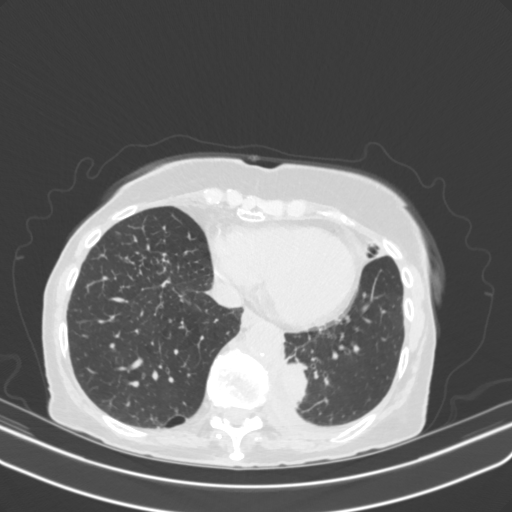
[im 80/174  lung]
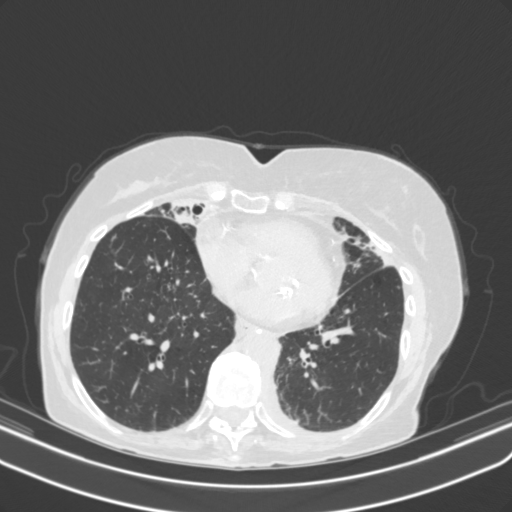
[im 94/174  lung]
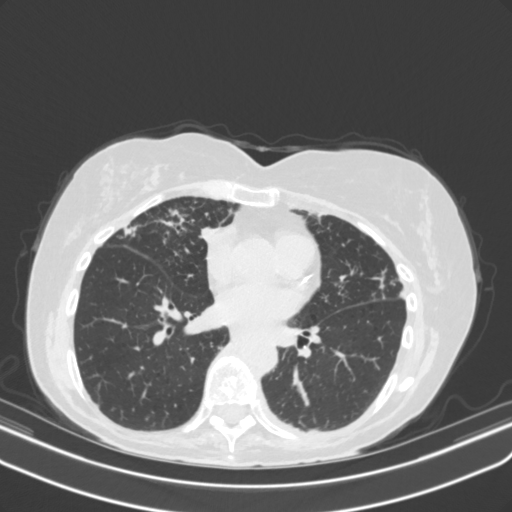
[im 107/174  lung]
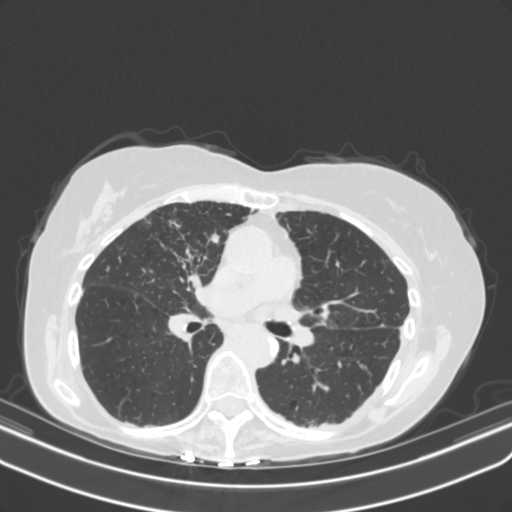
[im 120/174  mediastinal]
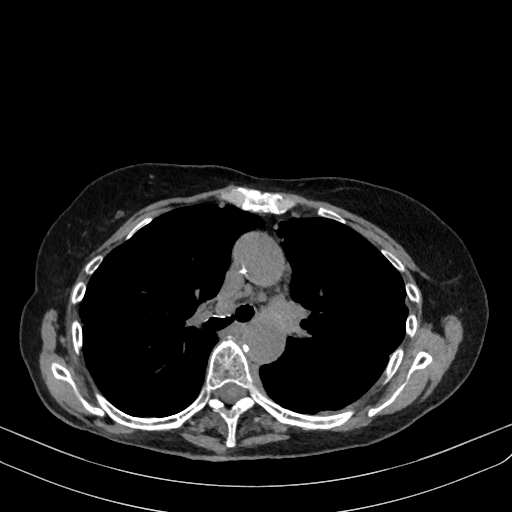
[im 120/174  lung]
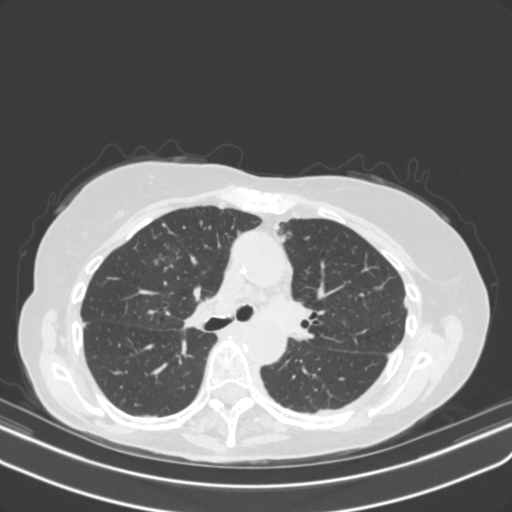
[im 134/174  lung]
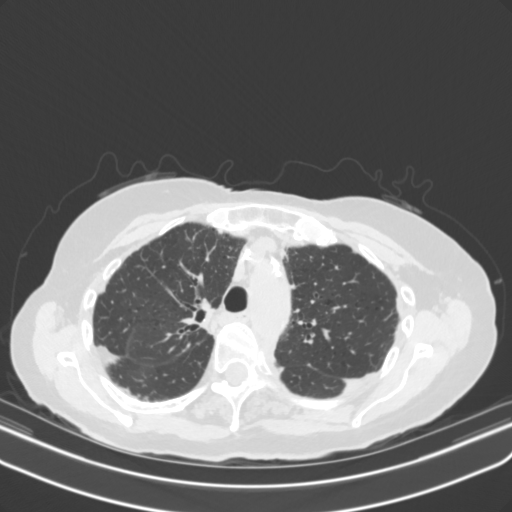
[im 147/174  lung]
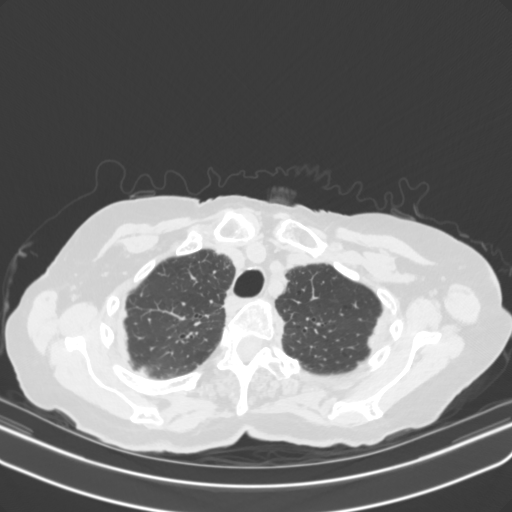
[im 160/174  lung]
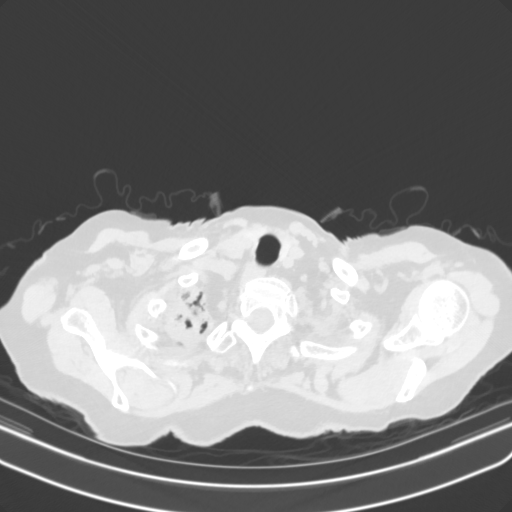

[Series 4: coronal · coronal · 0.68mm/px · 3 of 108 slices shown]
[im 22/108  lung]
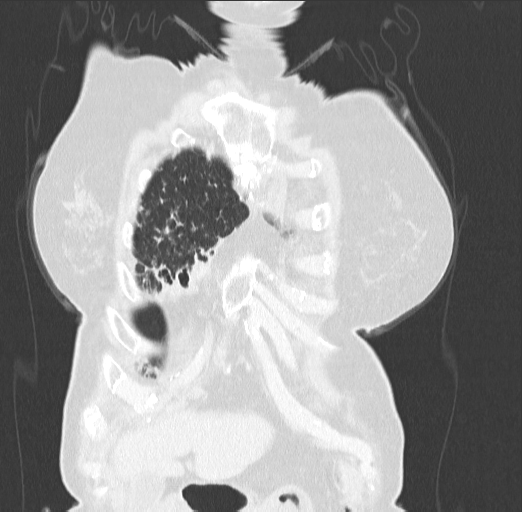
[im 43/108  lung]
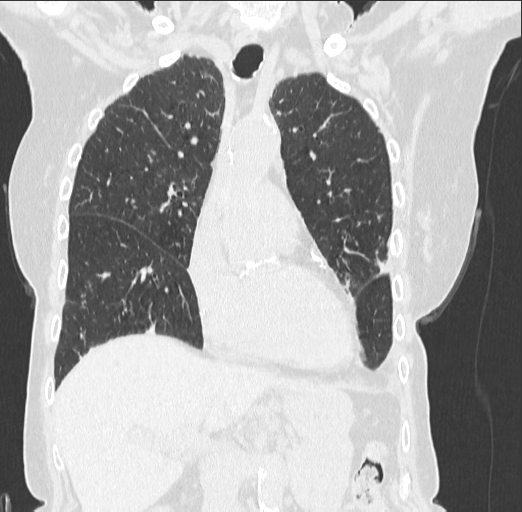
[im 65/108  lung]
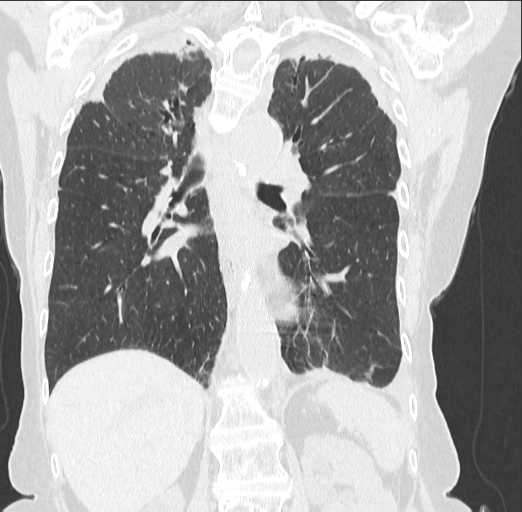

[15 of 36 positions shown; findings below may reference images not displayed]

FINDINGS: There is been interval progression of reticular nodular infiltrates in 
the right middle lobe and lingula with some mild associated bronchiectatic 
changes and some mild associated atelectasis or infiltrate. There is stable 
biapical scarring. There is also increase of left pleural effusion with some 
suspected round atelectasis in the left lower lobe which is a new finding and 
measures up to 2.0 cm x 4.3 cm. There are borderline but stable mediastinal 
lymph nodes present measuring up to 8 mm in their short axis. No axillary or 
hilar adenopathy. There are moderate coronary calcifications with calcifications 
in the aortic valve. Heart size normal and no pericardial effusions. 
Limited CT of the upper abdomen demonstrates normal visualized portion of the 
liver and spleen. The left adrenal is normal.
IMPRESSION: Progression of reticular nodular infiltrates in the right middle lobe and 
lingula with some focal infiltrate or atelectasis and some bronchiectatic 
changes. In addition there is slight increase of left pleural effusion with 
suspected round atelectasis in the left lower lobe. The above findings are 
likely due to infectious/inflammatory etiology such as MUSTAPARTA/VIJAY but other 
infectious inflammatory etiology cannot be excluded and would recommend 
follow-up with pulmonary consultation. 
Stable biapical scarring. 
Stable borderline size mediastinal lymph nodes. 
RADIATION DOSE REDUCTION: All CT scans are performed using radiation dose 
reduction techniques, when applicable.  Technical factors are evaluated and 
adjusted to ensure appropriate moderation of exposure.  Automated dose 
management technology is applied to adjust the radiation doses to minimize 
exposure while achieving diagnostic quality images.

## 2019-06-25 IMAGING — CT CT ABDOMEN AND PELVIS WITH CONTRAST
2 of 3 series · 15 of 46 positions shown, 17 images · IV contrast (APPLIED)
Comparison: Comparison was made to the prior exam(s) within the last 12 months 
dated  CT the chest 02/05/2019 and an older CT the abdomen and pelvis from 0618

CT ABDOMEN AND PELVIS WITH CONTRAST, 06/25/2019 [DATE]: 
CLINICAL INDICATION:  Weight loss. Indigestion. 
A search for DICOM formatted images was conducted for prior CT imaging studies 
completed at a non-affiliated media free facility.
TECHNIQUE: The abdomen and pelvis were scanned from lung bases through the 
pubic rami with 100 cc's of Isovue 300 injected intravenously on a 
high-resolution Ct scanner using dose reduction techniques.  Routine MPR 
reconstructions were performed.

[Series 4: abd/pel ax w · axial · 0.83mm/px · z∈[-510,-105]mm · 12 of 155 slices shown, 14 images]
[im 10/155  soft-tissue]
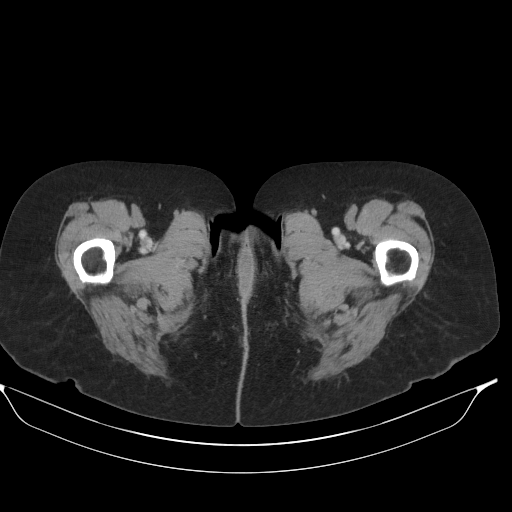
[im 10/155  bone]
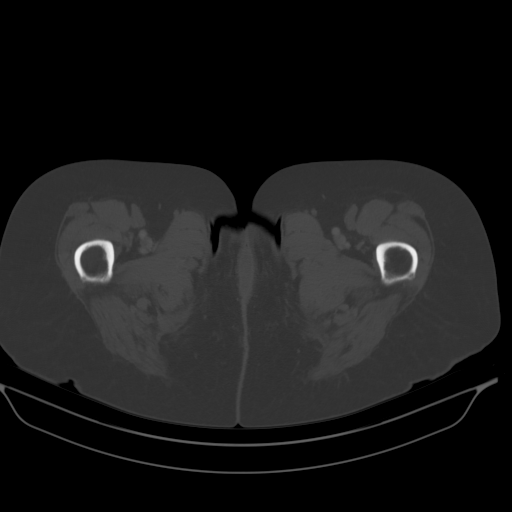
[im 20/155  soft-tissue]
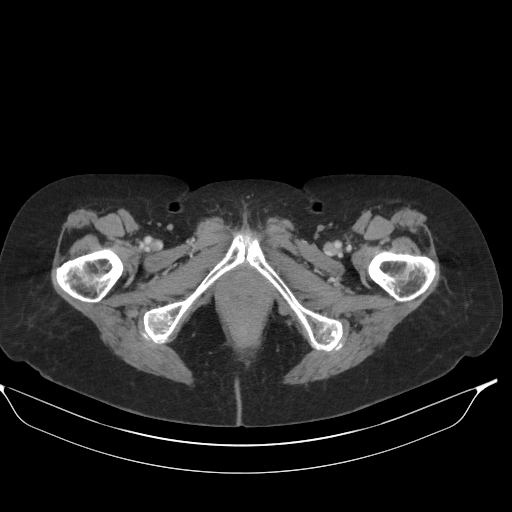
[im 35/155  soft-tissue]
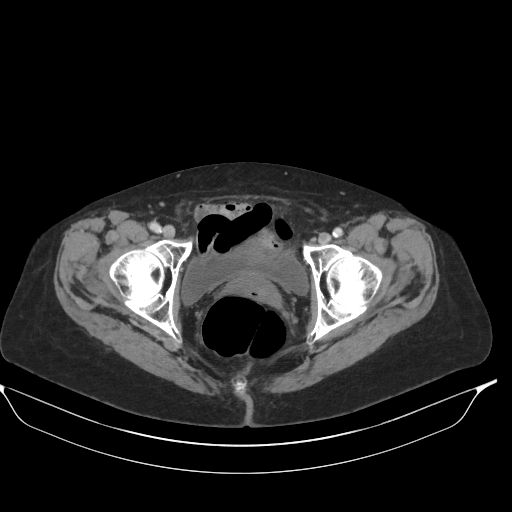
[im 45/155  soft-tissue]
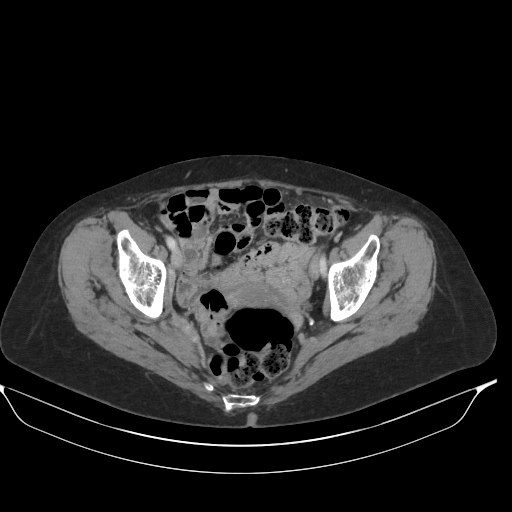
[im 60/155  soft-tissue]
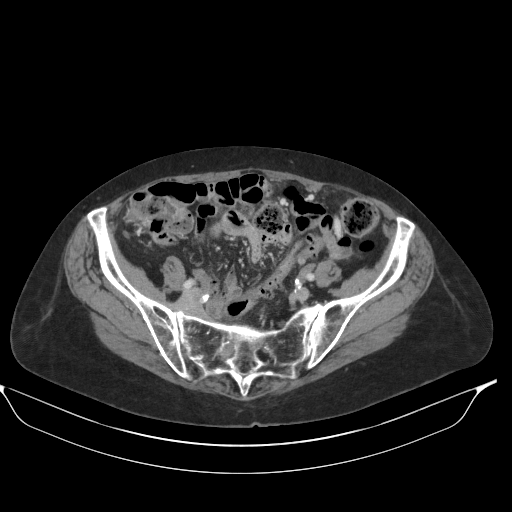
[im 70/155  soft-tissue]
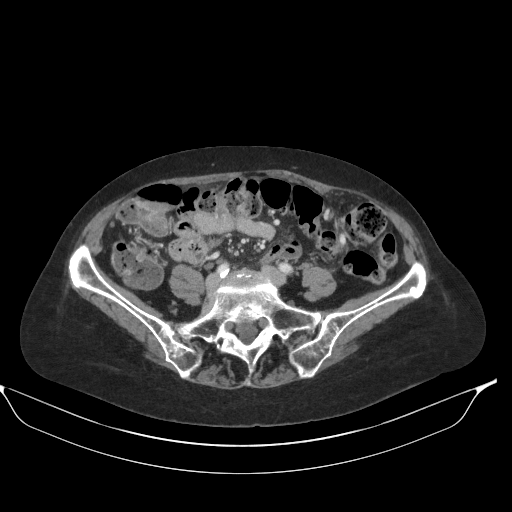
[im 85/155  soft-tissue]
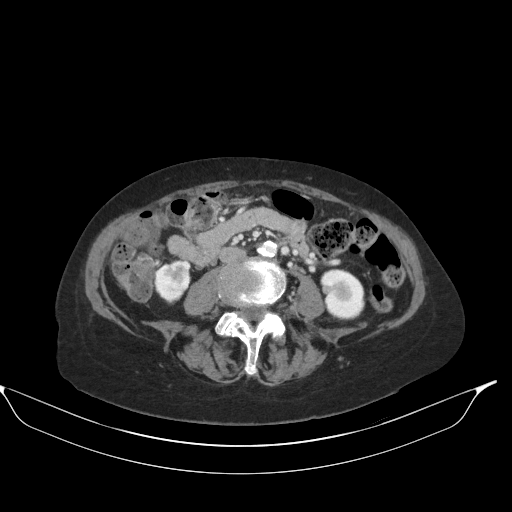
[im 95/155  soft-tissue]
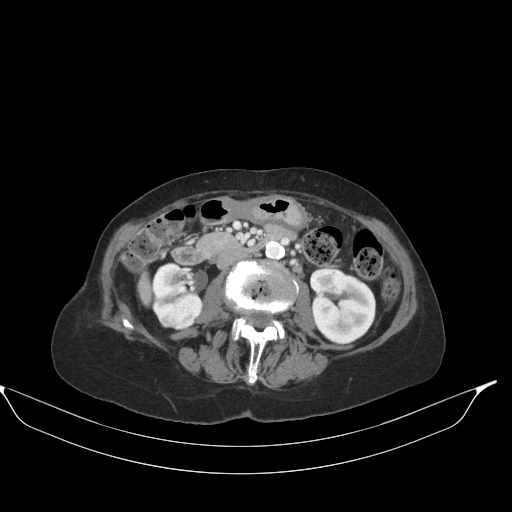
[im 110/155  soft-tissue]
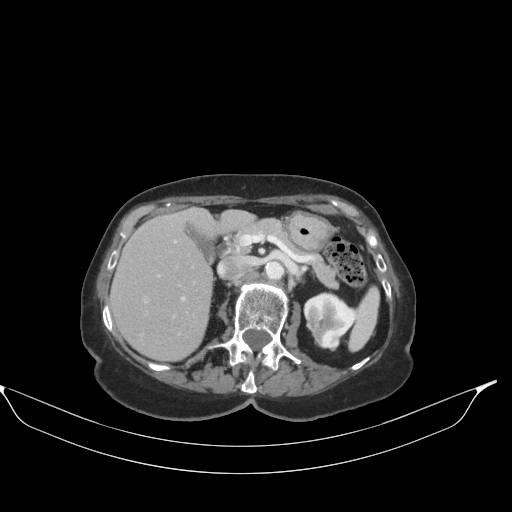
[im 110/155  bone]
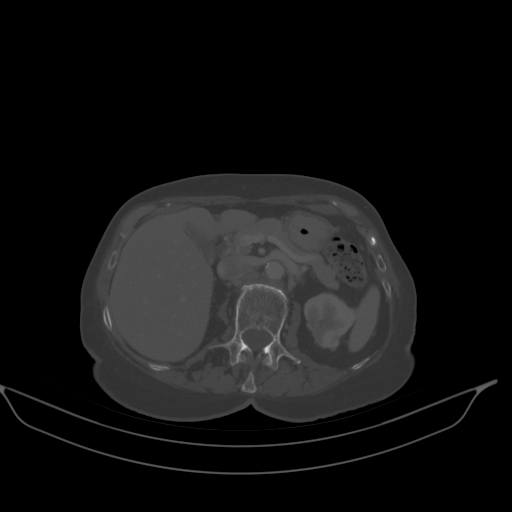
[im 120/155  soft-tissue]
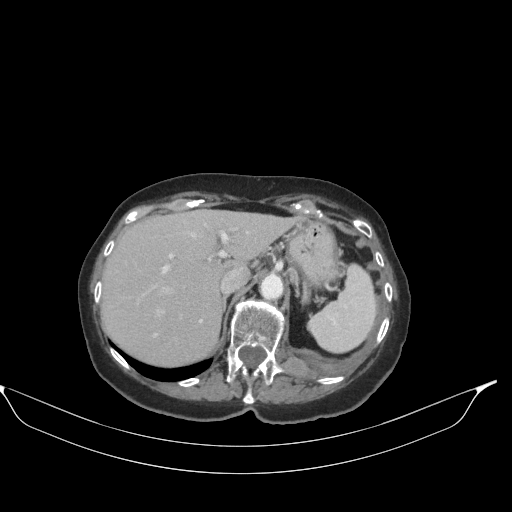
[im 135/155  soft-tissue]
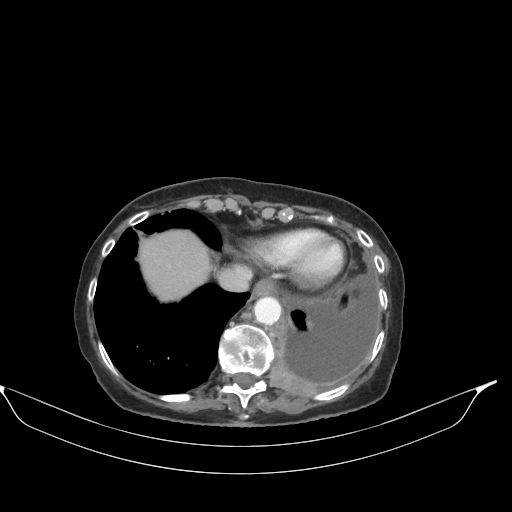
[im 145/155  soft-tissue]
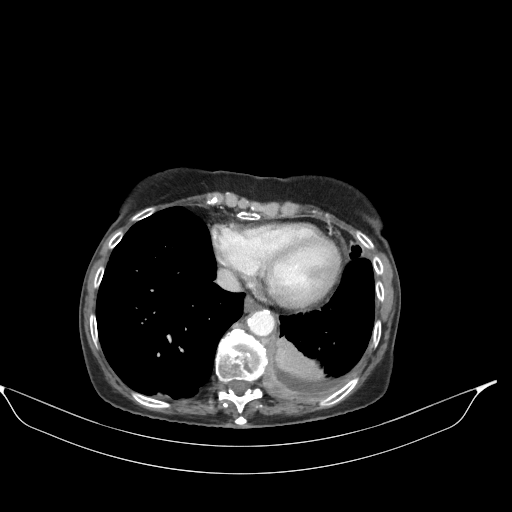

[Series 5: abd/pel cor w · coronal · 0.77mm/px · 3 of 111 slices shown]
[im 37/111  soft-tissue]
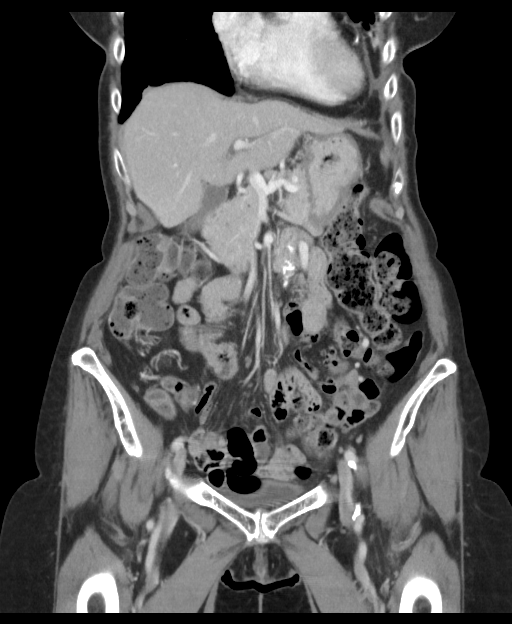
[im 49/111  soft-tissue]
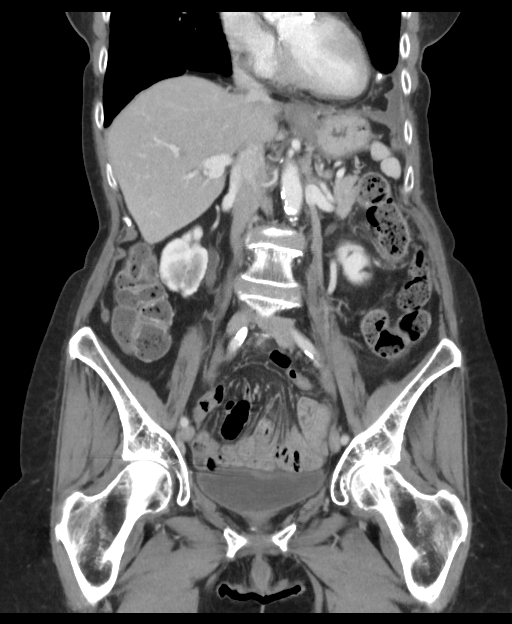
[im 62/111  soft-tissue]
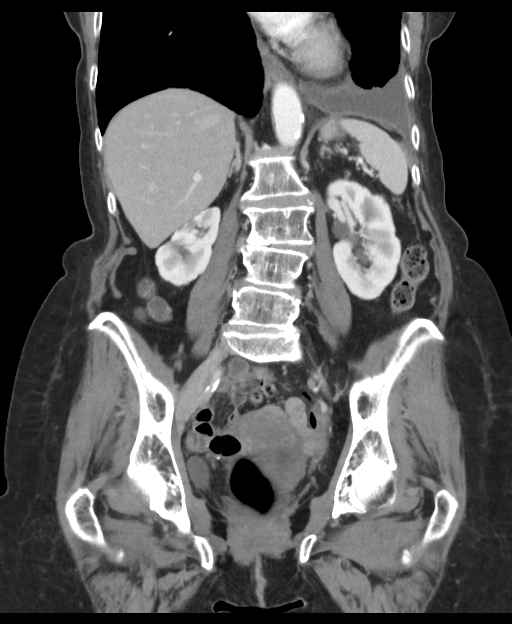

[15 of 46 positions shown; findings below may reference images not displayed]

FINDINGS: Again note is made of the inflammatory changes within the right 
middle lobe lingula and right lower lobe anteriorly. These findings are better 
visualized on the CT the chest from May. The left pleural effusion which was 
seen back in May has definitely progressed since 3850 and even appears to 
progressed since last May. The area of described rounded atelectasis is new 
since 3850. Nonspecific. However, there is marked thickening of the posterior 
pleura within the left posterior sulcus with now what appears to be secondary 
invasion of the posterior chest wall and left 12th rib with fracture of the rib. 
This soft tissue mass is best measured on axial image 31 at 37 x 25 mm. This 
extends up along the medial aspect of the vertebral bodies in this region. This 
extends into the left T12-L1 neural foramina. This also appears to extend up to 
and partially surround the descending thoracic aorta possibly extending to the 
right of the aorta is well in this region anterior to the T10 vertebral body. 
There also appears to be now involvement of the spleen suggesting extension 
through the diaphragm is well. This new splenic lesion measures upwards of 13 mm 
in diameter on axial image 34. 
The liver, pancreas and adrenal glands are normal in appearance. Chronic changes 
seen in the kidneys with cortical scarring. May represent chronic changes 
secondary to atherosclerosis. There are atherosclerotic changes and degenerative 
changes. No additional adenopathy or mass seen. Normal appendix. No free fluid. 
Bladder is unremarkable in appearance.
IMPRESSION: Pleural malignancy left posterior sulcus appears to extend through the diaphragm 
to involve the spleen as well as the left posterior chest wall and left 12th 
rib. This also extends into the left T12-L1 neural foramina and into the spinal 
canal in this region. Finally, also extends up partially surrounds the 
descending thoracic aorta anterior to the T10 vertebral body. Incompletely 
evaluated. CT of the chest with contrast and PET scan may help better stage. 
Enlarging left pleural effusion. Soft tissue along the anterior aspect of the 
effusion could represent tumor versus rounded atelectasis. 
Known inflammatory changes partially visualized within the lung bases. 
Atherosclerotic changes and degenerative changes. 
RADIATION DOSE REDUCTION: All CT scans are performed using radiation dose 
reduction techniques, when applicable.  Technical factors are evaluated and 
adjusted to ensure appropriate moderation of exposure.  Automated dose 
management technology is applied to adjust the radiation doses to minimize 
exposure while achieving diagnostic quality images.

## 2019-07-06 IMAGING — CT PET CT SCAN TUMOR IMAGING SKULL TO THIGH
5 series · 25 of 25 positions shown · non-contrast
Comparison: none

PET CT SCAN TUMOR IMAGING SKULL TO THIGH, 07/06/2019 [DATE]: 
CLINICAL INDICATION:  Pleural mass.
TECHNIQUE: A dose of 13.5 millicuries of 18-FDG was administered intravenously 
and skull to thigh PET scanning was performed at 65 minutes. Tomographic scans 
were reconstructed in axial, coronal, and sagittal projections. The data was 
reconstructed into a three-dimensional volume rendered images and reviewed in a 
rotational cine loop. Serum blood glucose at the time of injection was 104 
mg/dl. 
COMPARISON EXAMINATION:  CT abdomen pelvis 06/25/2019 and CT chest 02/05/2019 
both within the last 12 months. PET/CT 05/13/2015.

[Series 2: body-low dose ct · axial · 5.0mm · 1.17mm/px · z∈[-1038,-186]mm · 6 of 342 slices shown]
[im 1/342]
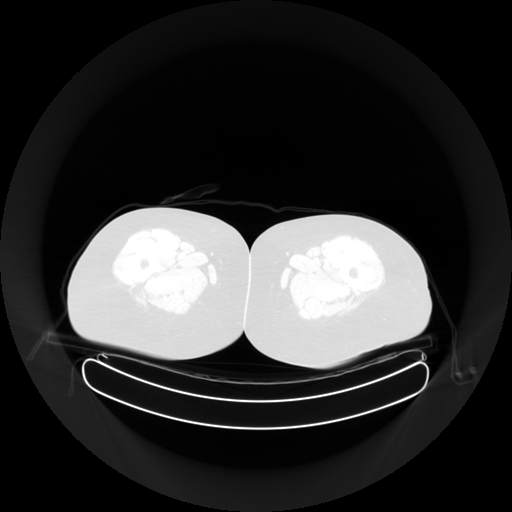
[im 69/342]
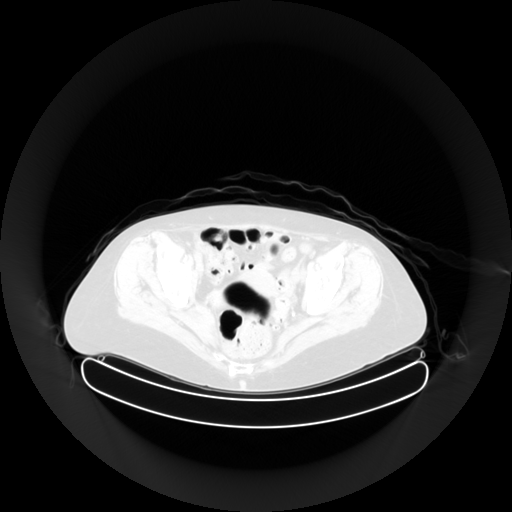
[im 137/342]
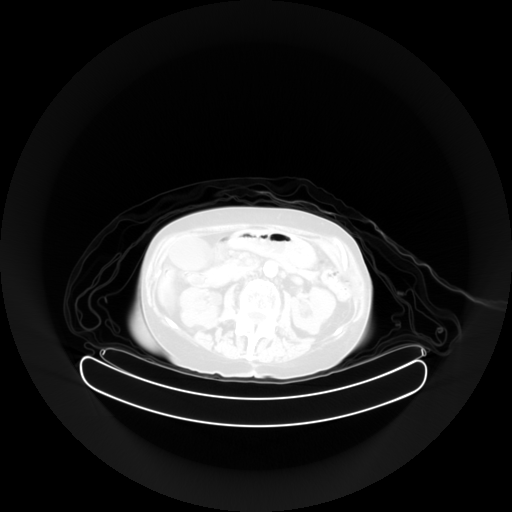
[im 205/342]
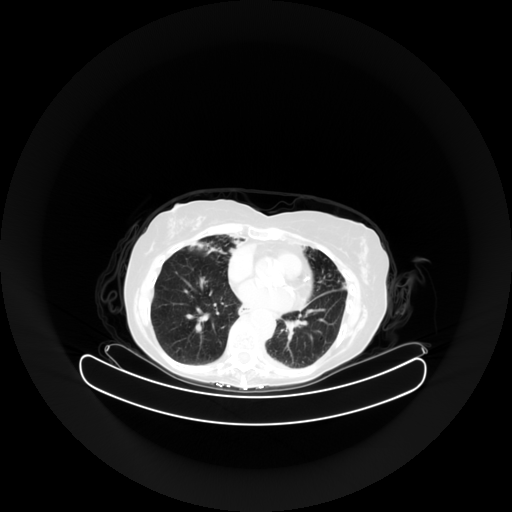
[im 273/342]
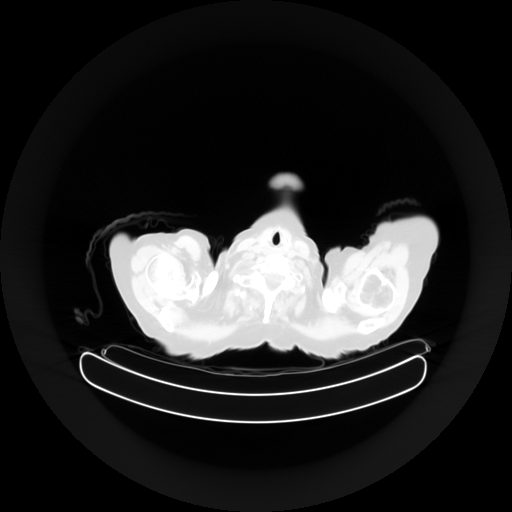
[im 342/342]
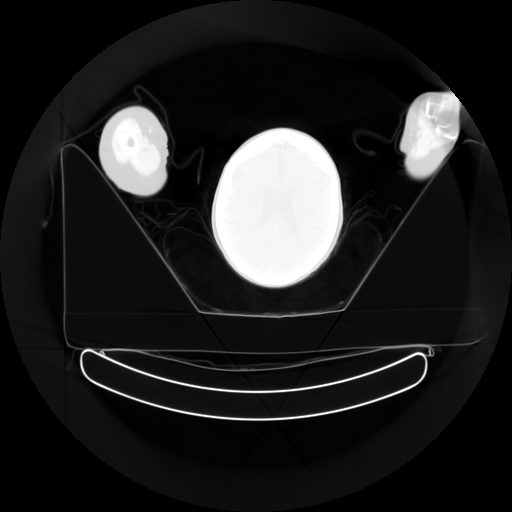

[Series 3: ct · axial · 5.0mm · 0.98mm/px · z∈[-1036,-186]mm · 4 of 171 slices shown]
[im 1/171]
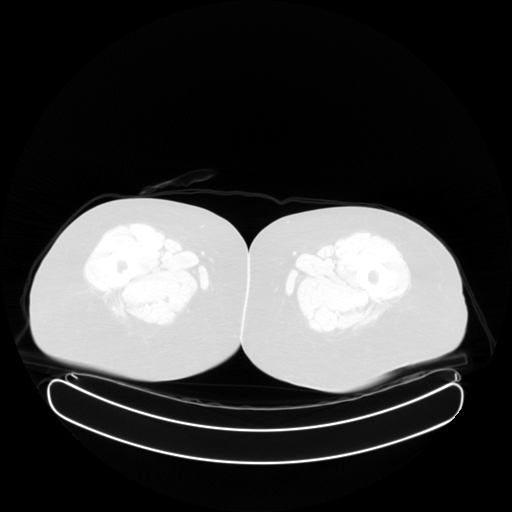
[im 57/171]
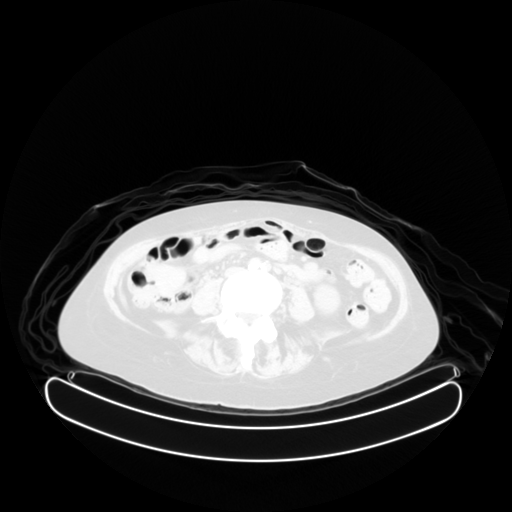
[im 114/171]
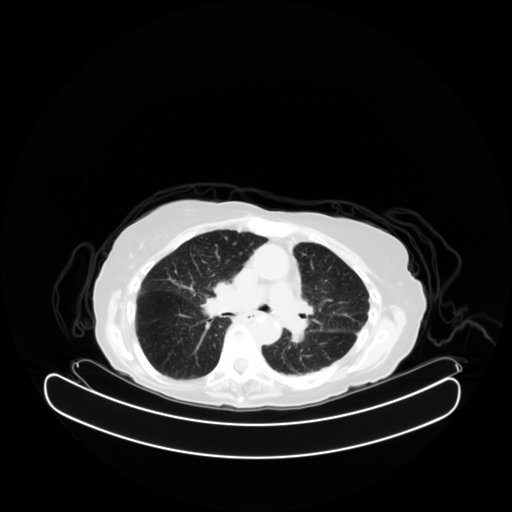
[im 171/171  brain]
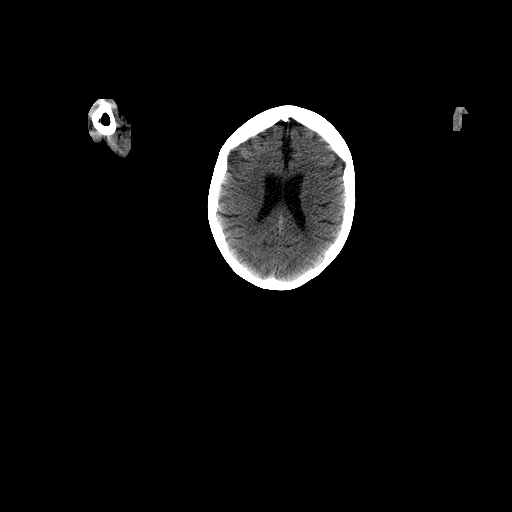

[(preview) st pet · axial · 4.0mm · 4.00mm/px · z∈[-1037,-189]mm · 5 of 213 slices shown]
[im 1/213]
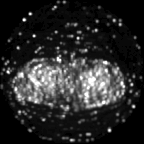
[im 54/213]
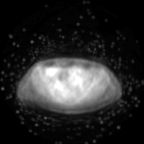
[im 107/213]
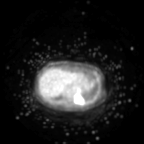
[im 160/213]
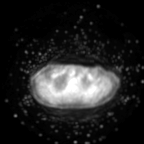
[im 213/213]
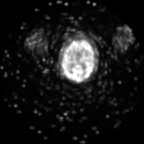

[(wb_nac) st pet · axial · 4.0mm · 4.00mm/px · z∈[-1037,-189]mm · 5 of 213 slices shown]
[im 1/213]
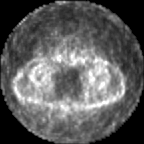
[im 54/213]
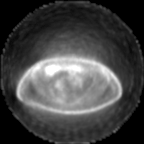
[im 107/213]
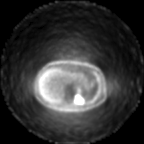
[im 160/213]
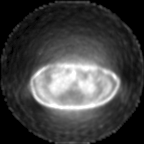
[im 213/213]
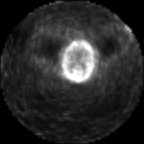

[(wb_ctac) st pet · axial · 4.0mm · 4.00mm/px · z∈[-1037,-189]mm · 5 of 213 slices shown]
[im 1/213]
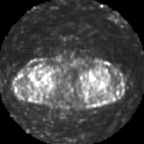
[im 54/213]
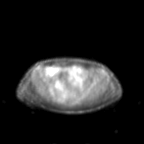
[im 107/213]
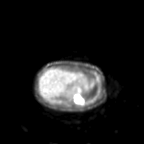
[im 160/213]
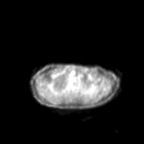
[im 213/213]
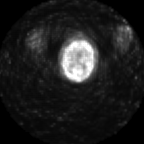

[25 of 25 positions shown; findings below may reference images not displayed]

FINDINGS: Extensive FDG avid malignancy is present in the pleural space along the 
posterior aspect of the inferior LEFT thorax as well as in the chest wall with 
lytic destruction of the posterior LEFT 7th rib and LEFT 12th rib. Tumor has 
also extended into the posterior inferior mediastinum. There has been extension 
into the abdomen with involvement of the LEFT quadratus lumborum muscle and 
spleen. Max SUVs are up to 25. 
There is patchy FDG avidity in the RIGHT middle lobe corresponding to small foci 
of pneumonia superimposed upon chronic appearing bronchiolitis, bronchiectasis 
and volume loss. Max SUVs are up to 10.5. At this location there is a spiculated 
mass in the lateral RIGHT middle lobe measuring 16 mm which is probably 
inflammatory but the possibility of malignancy is not excluded. 
No hepatic or adrenal FDG avidity is found. Incidental physiologic urinary tract 
and GI tract activity is in the abdomen and pelvis.
IMPRESSION: 1. Extensive highly FDG avid infiltrating mass likely arising from the LEFT 
pleural space invading the posterior chest wall, spleen and quadratus lumborum 
muscle. Primary differential diagnosis is mesothelioma versus pleural metastasis 
from unknown primary malignancy. Percutaneous CT-guided needle biopsy is 
recommended for diagnosis. 
2. Patchy FDG avid disease in the RIGHT middle lobe appears predominantly 
inflammatory although there is a spiculated mass present which could potentially 
represent neoplasm.

## 2019-07-07 ENCOUNTER — Inpatient Hospital Stay
Admit: 2019-07-07 | Discharge: 2019-07-08 | Disposition: A | Discharge status: Home / Self Care | Payer: MEDICARE | Source: Ambulatory Visit

## 2019-07-08 DIAGNOSIS — C8339 Diffuse large B-cell lymphoma, extranodal and solid organ sites: Secondary | ICD-10-CM

## 2019-07-13 ENCOUNTER — Inpatient Hospital Stay
Admit: 2019-07-13 | Discharge: 2019-07-14 | Disposition: A | Discharge status: Home / Self Care | Payer: MEDICARE | Source: Ambulatory Visit

## 2019-07-14 DIAGNOSIS — C8330 Diffuse large B-cell lymphoma, unspecified site: Secondary | ICD-10-CM

## 2019-09-25 IMAGING — CT CT CHEST/ABDOMEN AND PELVIS WITH CONTRAST  ([facility])
1 of 4 series · 11 of 32 positions shown, 17 images · IV contrast (agent unspecified)
Comparison: CT PET 07/06/2019, CT abdomen and pelvis 06/25/2019, CT chest 
02/05/2019, CT chest 03/28/2017

CT CHEST/ABDOMEN AND PELVIS WITH CONTRAST  ([facility]), 09/25/2019 [DATE]: 
(Films were taken at [facility].) 
CLINICAL INDICATION:  86-year-old female with diffuse large B cell lymphoma, 
intrathoracic lymph nodes

[Series 2: cap w · axial · 0.80mm/px · z∈[-640,-97]mm · 11 of 215 slices shown, 17 images]
[im 17/215  soft-tissue]
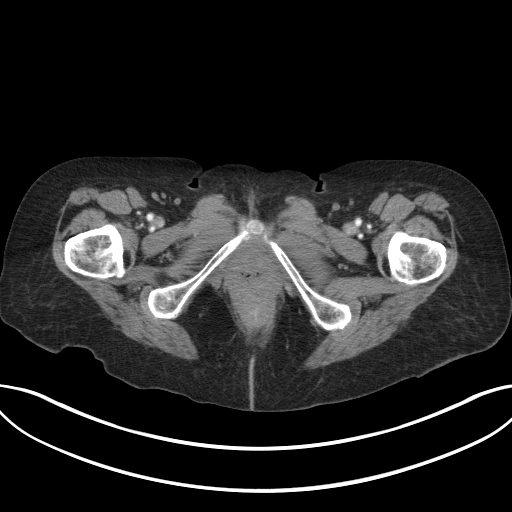
[im 17/215  bone]
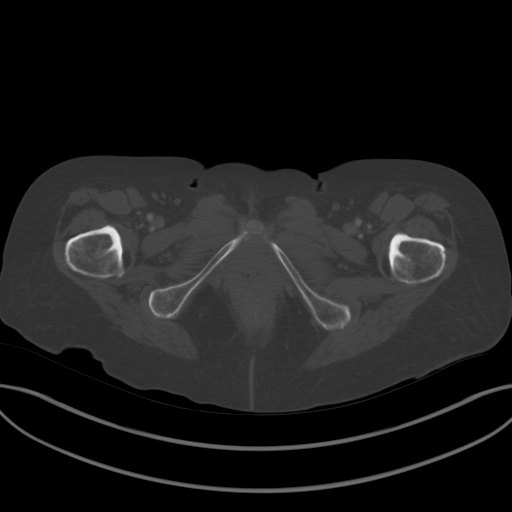
[im 33/215  soft-tissue]
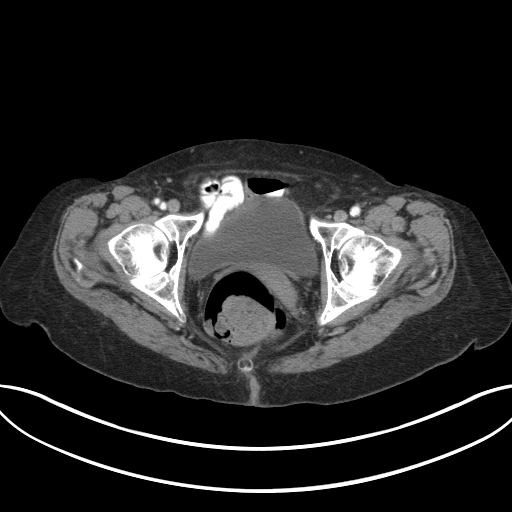
[im 50/215  soft-tissue]
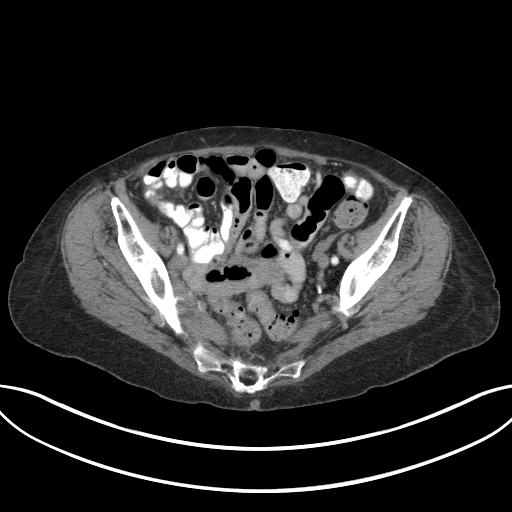
[im 66/215  soft-tissue]
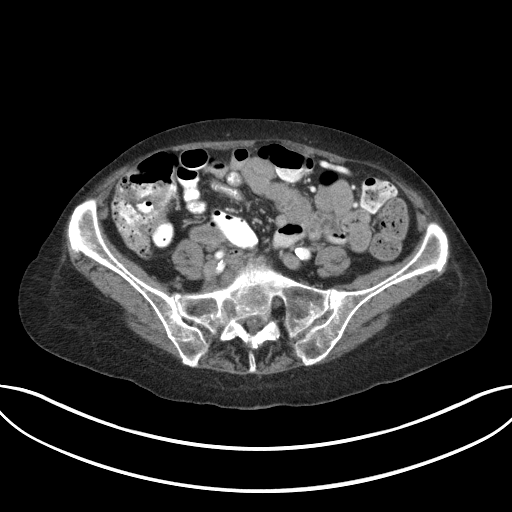
[im 83/215  soft-tissue]
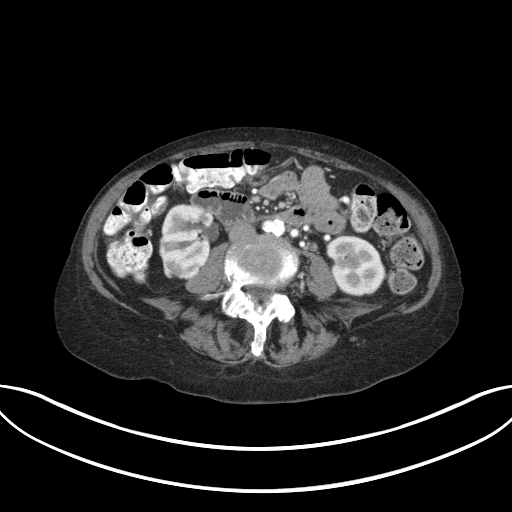
[im 116/215  soft-tissue]
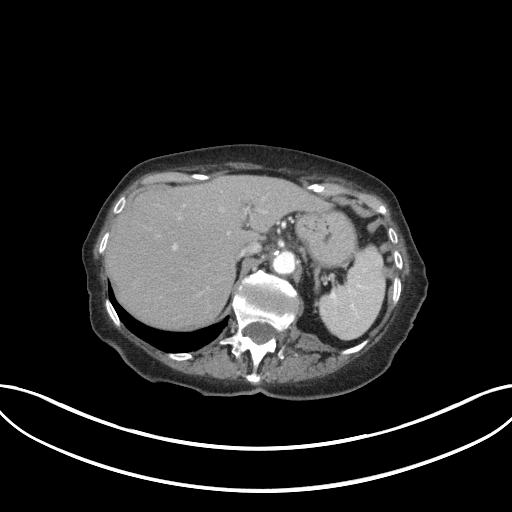
[im 132/215  soft-tissue]
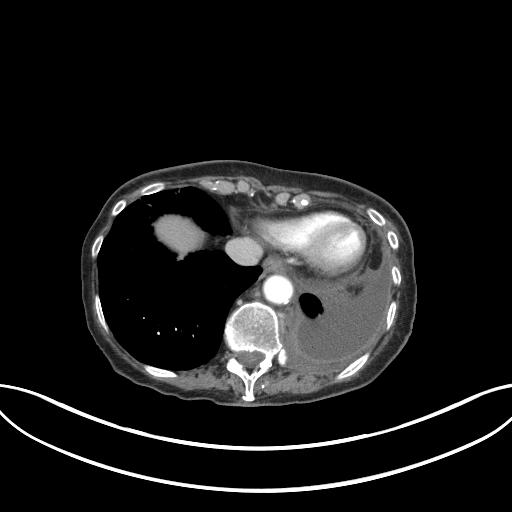
[im 149/215  soft-tissue]
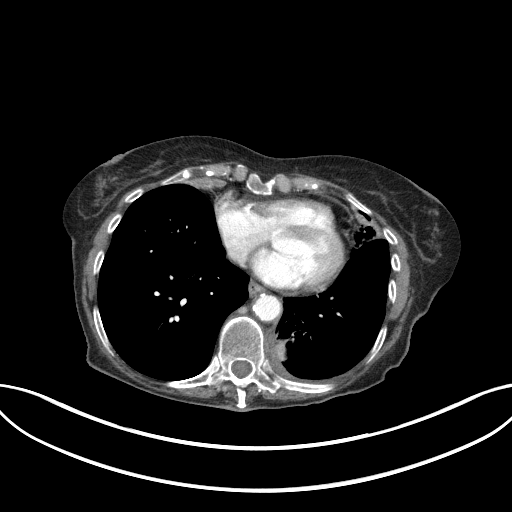
[im 149/215  lung]
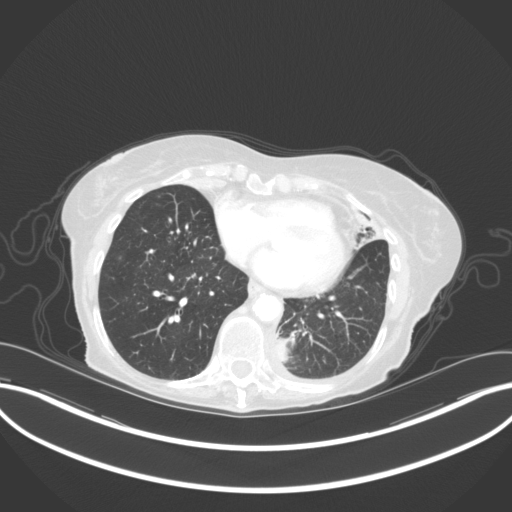
[im 165/215  soft-tissue]
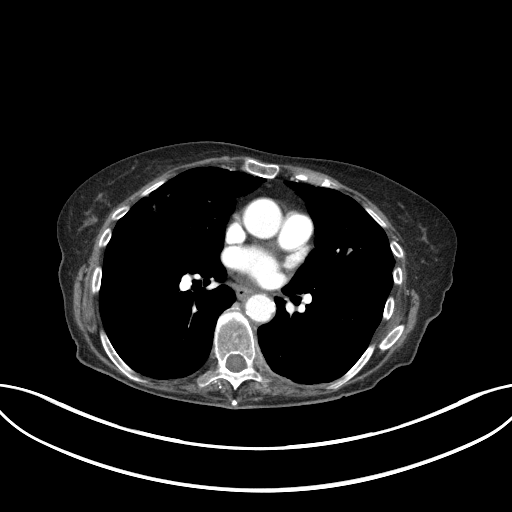
[im 165/215  lung]
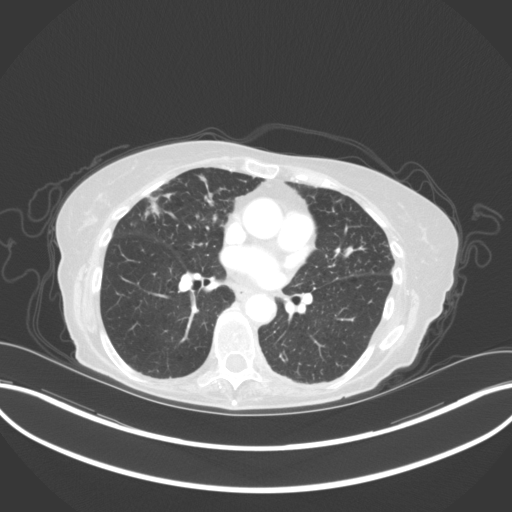
[im 165/215  bone]
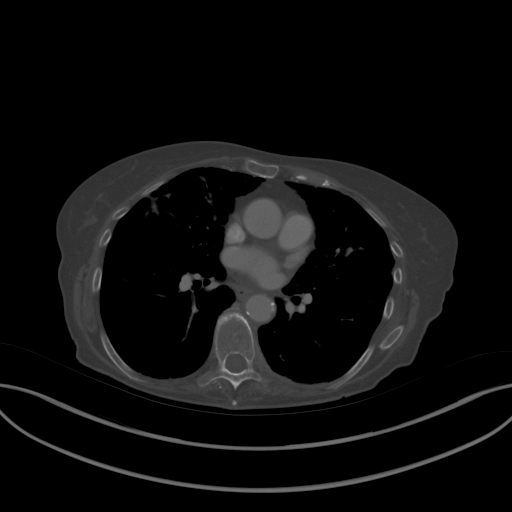
[im 182/215  soft-tissue]
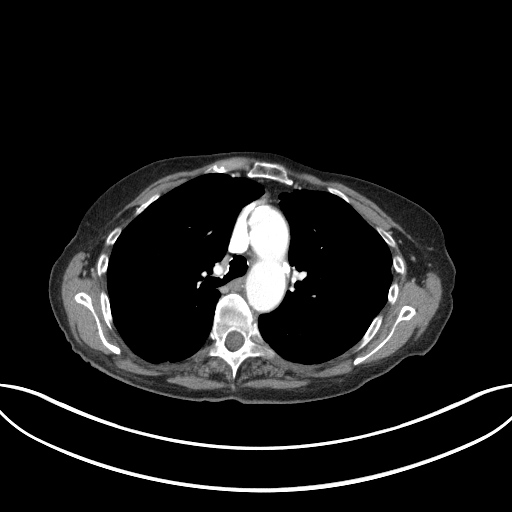
[im 182/215  lung]
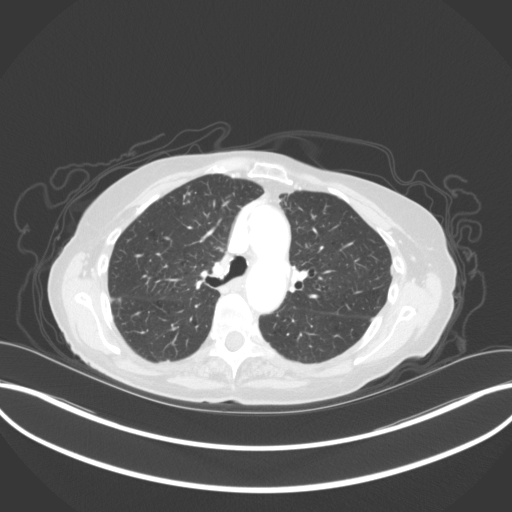
[im 198/215  soft-tissue]
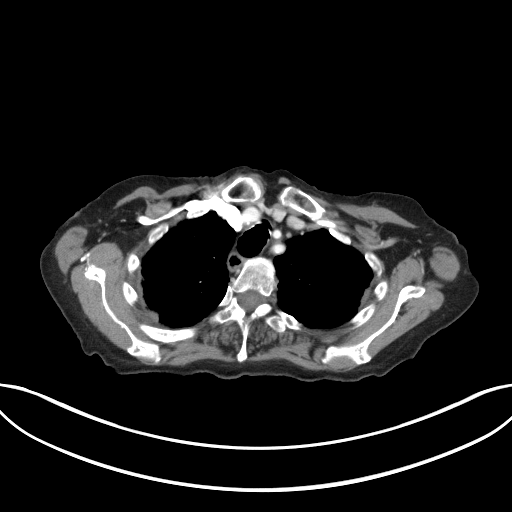
[im 198/215  lung]
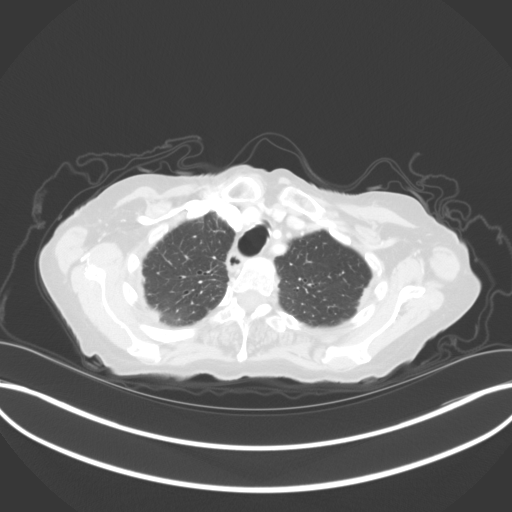

[11 of 32 positions shown; findings below may reference images not displayed]

FINDINGS: Chest: Pathologic fracture through LEFT posterior 12th rib again seen image 89. 
The sclerotic irregularity of the LEFT posterior seventh rib is again 
identified. There is sclerosis of the LEFT costovertebral fifth rib and an old 
healed fracture of the LEFT medial posterior costovertebral fourth rib and third 
rib. The paravertebral mass in the LEFT posterior thorax seen discretely and 
[place] is very ill-defined. Based on the sagittal series for localization, 
residual soft tissue density is approximately 2.7 x 1.6 cm image 90 on the axial 
series. There does appear to be invasion into the LEFT neural foramen at T12 
image 91. Persistent small pleural effusion with basilar consolidation in the 
LEFT lung. The previously noted extension into the posterior inferior 
mediastinum also appears to be markedly improved, whereas it previously extended 
up around the aorta on the LEFT , no extension is seen at the base of the heart. 
There is residual pleural thickening. Left quadratus lumborum remains 
asymmetrically enlarged. Pleural calcification in the inferior LEFT lung base. 
Atherosclerosis. Small mediastinal nodes. Focal pleural fluid posterior to the 
RIGHT pulmonary artery. Mildly patulous esophagus. No axillary adenopathy. Small 
AP window and precarinal nodes. 
The previously noted spiculated mass in the lateral segment RIGHT middle lobe 
has decreased size and appears to have a central air-filled thin-walled cavity. 
Adjacent bronchiectasis with mucus plugging and bronchiolitis/bronchitis in both 
the RIGHT middle lobe adjacent to it and in the lingula and anterior RIGHT lower 
lobe. Scarring in both lung apices. 
Abdomen/pelvis: 
Small splenic cleft in the superior spleen but no definite tumor involvement at 
this time, interval regression of the 1.3 cm lesion seen previously stable 7 mm 
hepatic hypodensity presumably a cyst. Mild fatty liver. Unremarkable pancreas, 
bilateral adrenal glands. Bilateral renal cortical lobularity and scarring with 
no definite tumor. Heavily atherosclerotic aorta. Normal enhancement and 
mesenteric vessels. Hypertrophied inferior mesenteric artery appears to have a 
tight stenosis at its origin.  Moderately distended gallbladder. 
Unremarkable decompressed stomach. Nonobstructive small bowel. Diffuse fecal 
retention with moderately large rectal stool ball. Moderately distended urinary 
bladder. Anteverted uterus. Levoscoliosis lumbosacral spine. No free fluid or 
free air. No inguinal, iliac or significant retroperitoneal adenopathy. Normal 
contrast excretion bilaterally.
IMPRESSION: Marked improvement in the posterior inferior pleural malignancy which was 
previously noted to extend through the diaphragm to involve the spleen, LEFT 
posterior chest wall, LEFT 12th rib, seventh rib and through the LEFT T12/L1 
neural foramen as well as in the posterior inferior mediastinum around the 
descending aorta up to the T10 vertebral body. Poor definition of the residual 
tumor in the posterior costophrenic sulcus is measured to be about 2.7 x 1.6 cm 
residual (previously measured 3.7 x 2.5 cm) but again the margins are hard to 
delineate. The posterior inferior involvement appears to have regressed and now 
appears only linear pleural thickening. Essentially resolved splenic lesion with 
residual cleft/3 mm low-density lesion. Persistent soft tissue extending to the 
LEFT neural foramen with sclerotic destruction of T12, T7 and old other healed 
rib fractures. 
Persistent RIGHT middle lobe and lingular inflammatory changes with mucous 
plugging and bronchiectasis. The 1.4 cm lateral segment RIGHT middle lobe 
spiculated lesion is smaller than on the CT PET and now has a central cavity 
which may be neoplastic or inflammatory, continued surveillance. 
No new areas of abnormality.

## 2019-12-10 IMAGING — CT CT CHEST/ABDOMEN AND PELVIS WITH CONTRAST  ([facility])
1 of 4 series · 12 of 32 positions shown, 18 images · IV contrast (isovue)
Comparison: Comparison was made to the prior exam(s) within the last 12 months 
dated  09/25/2019

CT CHEST/ABDOMEN AND PELVIS WITH CONTRAST  ([facility]), 12/10/2019 [DATE]: 
(Films were taken at [facility].) 
CLINICAL INDICATION:  Diffuse large B-cell lymphoma. Prior chemotherapy and 
radiation therapy. 
A search for DICOM formatted images was conducted for prior CT imaging studies 
completed at a non-affiliated media free facility.
TECHNIQUE: The chest, abdomen and pelvis were scanned with 100 ml of 
intravenous Isovue 300. Routine MPR reconstructions were performed.

[Series 2: cap w · axial · 0.98mm/px · z∈[-542,+49]mm · 12 of 231 slices shown, 18 images]
[im 17/231  soft-tissue]
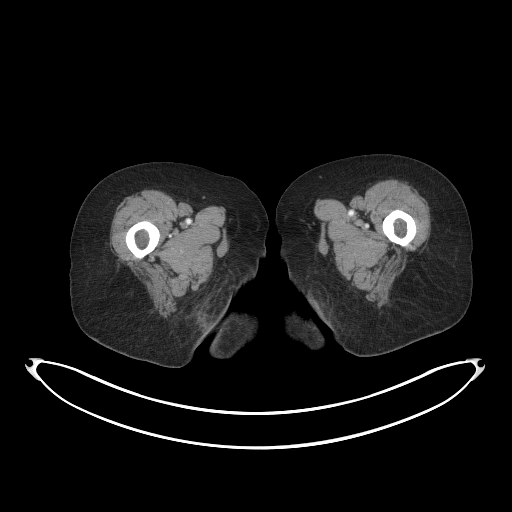
[im 17/231  bone]
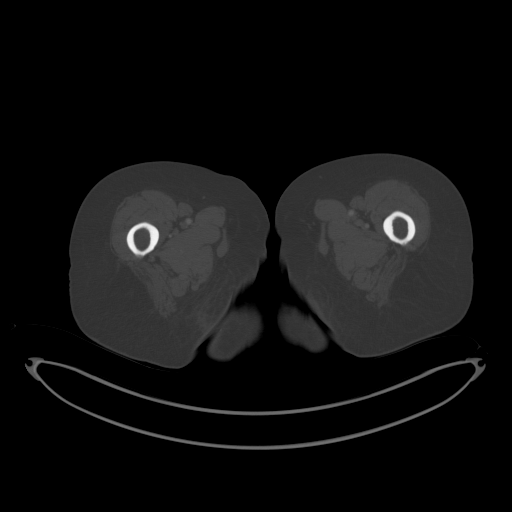
[im 33/231  soft-tissue]
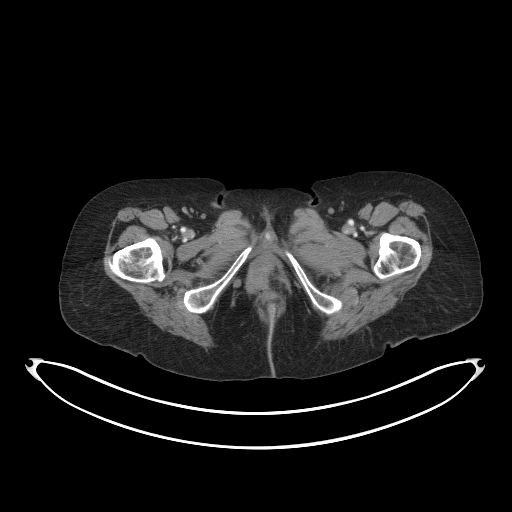
[im 50/231  soft-tissue]
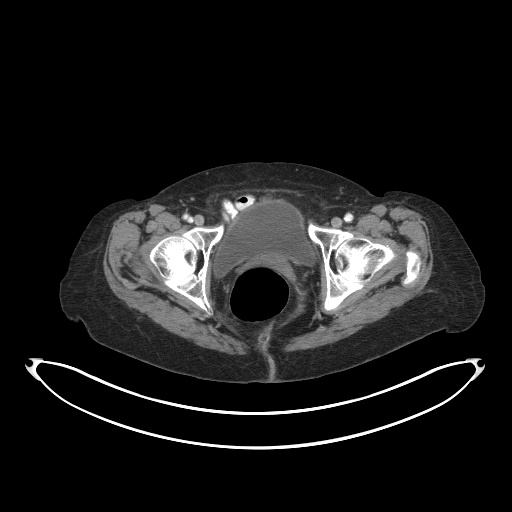
[im 66/231  soft-tissue]
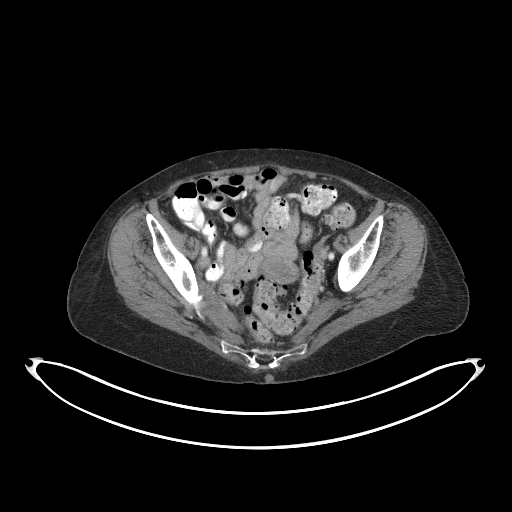
[im 83/231  soft-tissue]
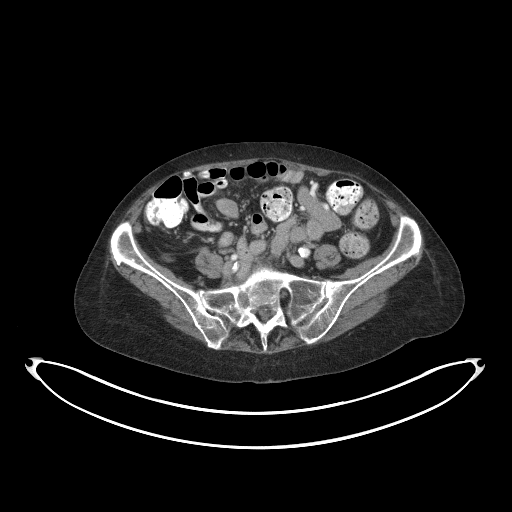
[im 99/231  soft-tissue]
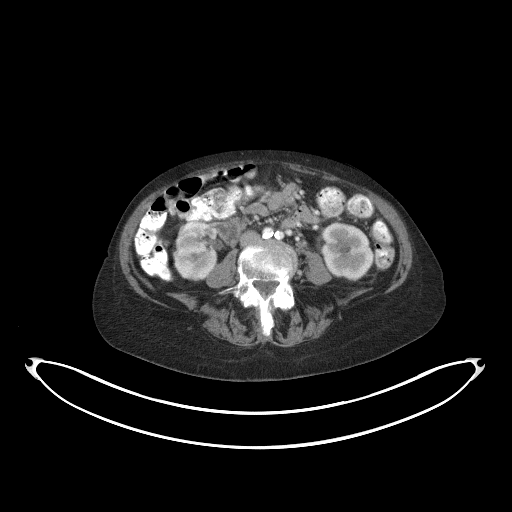
[im 132/231  soft-tissue]
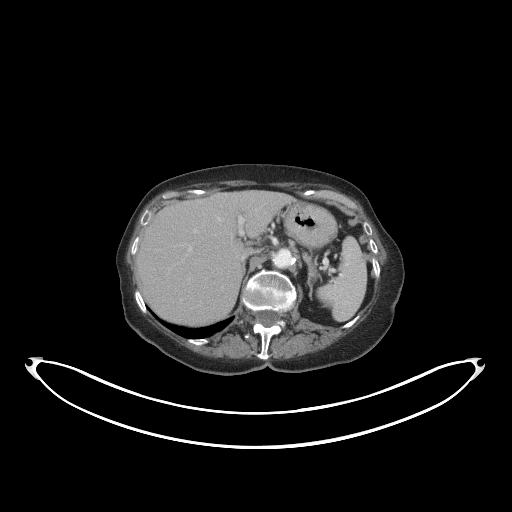
[im 148/231  soft-tissue]
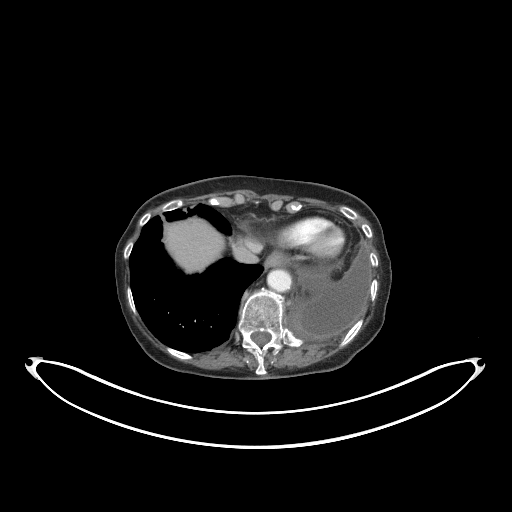
[im 165/231  soft-tissue]
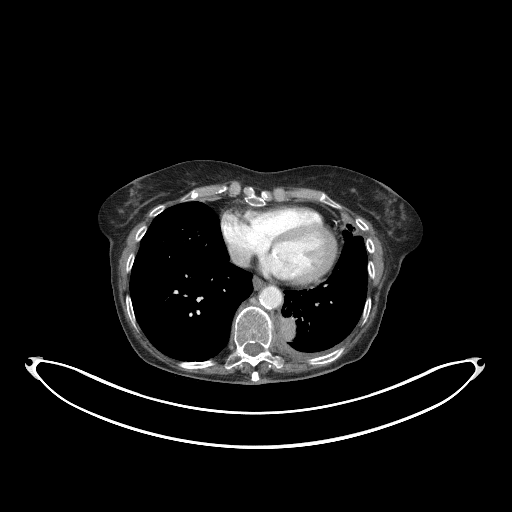
[im 165/231  lung]
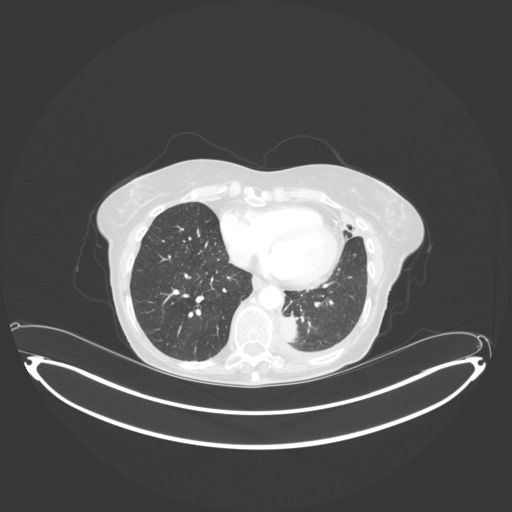
[im 165/231  bone]
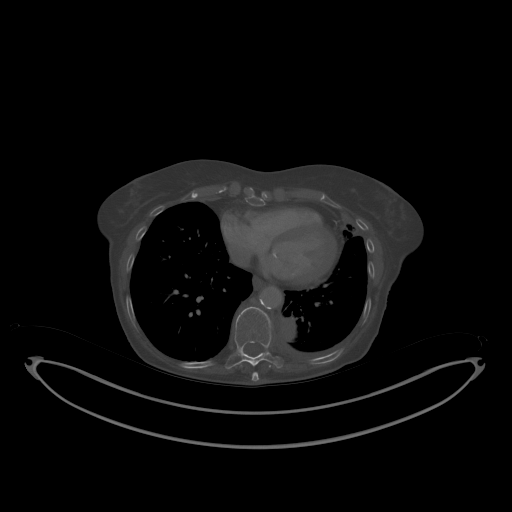
[im 181/231  soft-tissue]
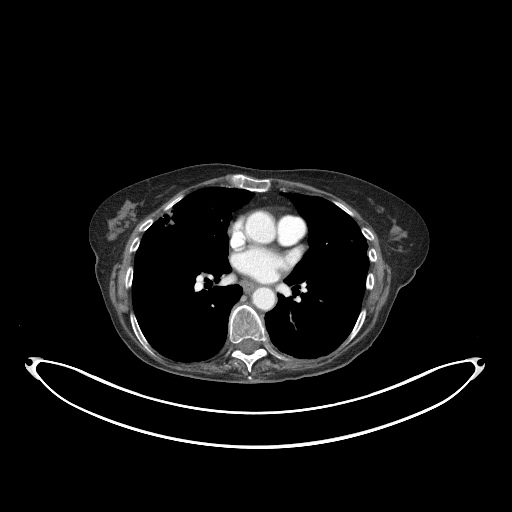
[im 181/231  lung]
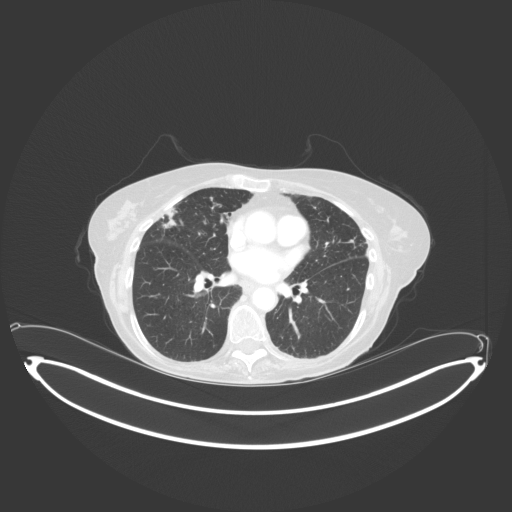
[im 198/231  soft-tissue]
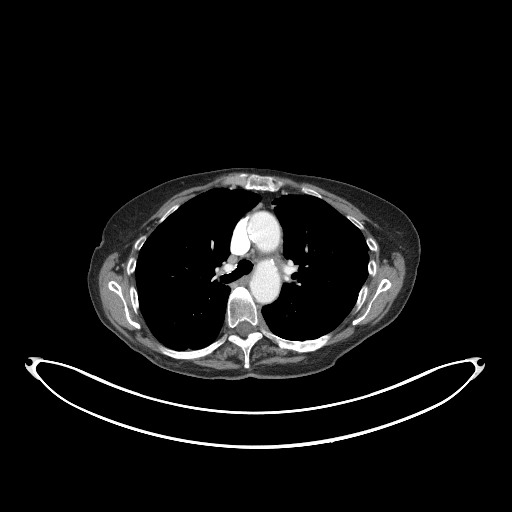
[im 198/231  lung]
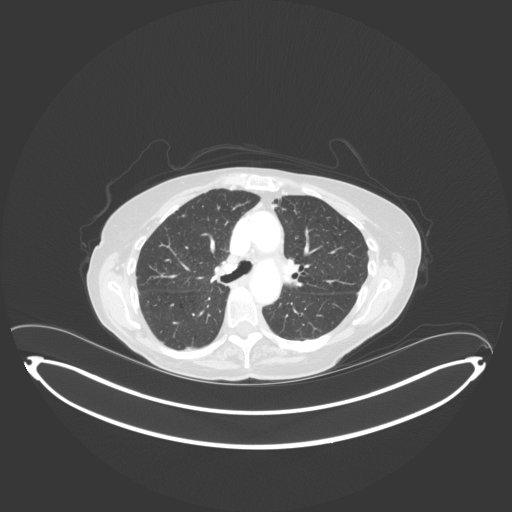
[im 214/231  soft-tissue]
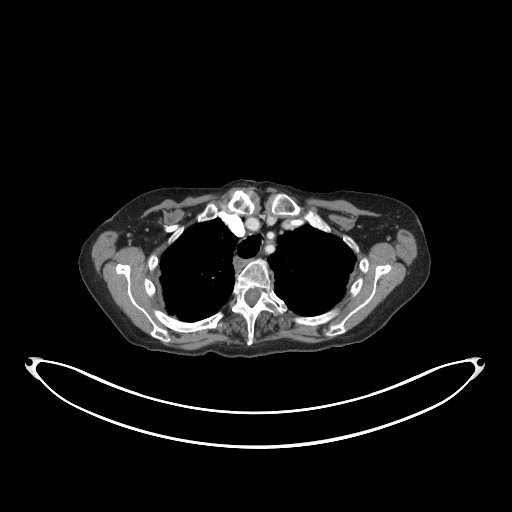
[im 214/231  lung]
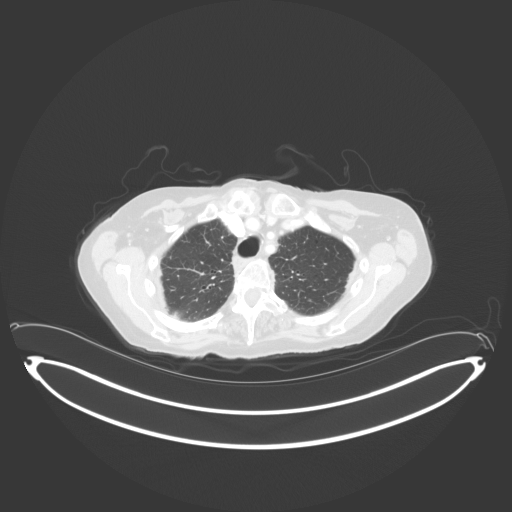

[12 of 32 positions shown; findings below may reference images not displayed]

FINDINGS: LUNGS/PLEURA: Stable ill-defined, infiltrating left posteromedial pleural-based 
soft tissue mass with extension into the T11-12 neural foramen and quadratus 
lumborum, which is difficult to discretely measure (sequence 2 image 90, 
sequence 4 images 42-43). Stable multifocal RML and lingular small nodular 
opacities and cylindrical bronchiectasis: atypical mycobacterium is a 
consideration. Biapical fibrosis. Stable multifocal pleural thickening, most 
marked along the RUL. Stable small left pleural effusion with adjacent 
atelectasis of the LLL. No pneumothorax. 
HEART: Heart is normal in size and configuration. 
MEDIASTINUM: No mass. 
AORTA/VESSELS: Atherosclerosis. No aneurysm. Left subclavian Port-A-Cath. 
PULMONARY ARTERIES: No pulmonary filling defects. Normal in caliber. 
LYMPH NODES: No lymphadenopathy. 
LIVER/BILE DUCTS: 0.6 cm superior hepatic cyst. No mass or biliary dilatation. 
GALLBLADDER: No cholelithiasis or wall thickening. 
SPLEEN: Normal in size. 
PANCREAS: Normal. 
ADRENAL GLANDS: Normal. 
KIDNEYS: New moderate left hydronephrosis to the level of the UPJ without 
identifiable calculus or mass. Multifocal bilateral renal scarring and mild 
right renal atrophy (9.4 cm in length). Right kidney measures 12.1 cm in length. 
STOMACH/BOWEL: No bowel wall thickening or obstruction. 
MESENTERY/PERITONEAL SPACE: No mass, free fluid or pneumoperitoneum. 
PELVIC ORGANS: Normal. 
MUSCULOSKELETAL: Interval healing of the left 12th rib fracture, and old left 
third and fourth rib fractures. Stable left posterior seventh rib mildly 
sclerotic elongated lesion with expansile remodeling. Degenerative change and 
mild thoracolumbar S-shaped curve. No lytic or blastic lesions.
IMPRESSION: 1.  New moderate left hydronephrosis to the level of the UPJ without 
identifiable calculus or mass. 
2.  Stable ill-defined, infiltrating left posteromedial pleural-based soft 
tissue mass with extension into the T11-12 neural foramen and quadratus 
lumborum. 
3.  Stable small left pleural effusion with adjacent atelectasis of the LLL. 
4.  No new adenopathy. 
5.  Otherwise, no change. 
RADIATION DOSE REDUCTION: All CT scans are performed using radiation dose 
reduction techniques, when applicable. Technical factors are evaluated and 
adjusted to ensure appropriate moderation of exposure. Automated dose management 
technology is applied to adjust the radiation doses to minimize exposure while 
achieving diagnostic quality images.

## 2019-12-29 IMAGING — PT PET CT SCAN TUMOR IMAGING SKULL TO THIGH  ([facility])
3 series · 25 of 25 positions shown · non-contrast
Comparison: none

PET CT SCAN TUMOR IMAGING SKULL TO THIGH  ([facility]), 12/29/2019 [DATE]: 
CLINICAL INDICATION:  Diffuse large B-cell lymphoma. Assessing response to 
treatment.
TECHNIQUE: A dose of 14.8 millicuries of 18-FDG was administered intravenously 
and skull to thigh PET scanning was performed at 60 minutes. Tomographic scans 
were reconstructed in axial, coronal, and sagittal projections. The data was 
reconstructed into a three-dimensional volume rendered images and reviewed in a 
rotational cine loop. Serum blood glucose at the time of injection was 84 mg/dl. 
COMPARISON EXAMINATION:  CT chest abdomen pelvis 12/10/2019. PET/CT 07/06/2019.

[Series 3: pet ac · axial · 4.0mm · 4.07mm/px · z∈[+184,+1060]mm · 9 of 220 slices shown]
[im 1/220]
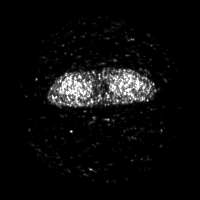
[im 28/220]
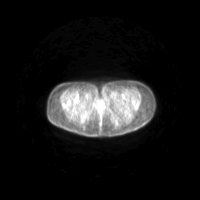
[im 55/220]
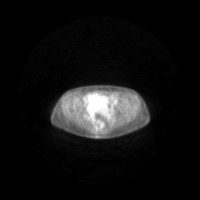
[im 83/220]
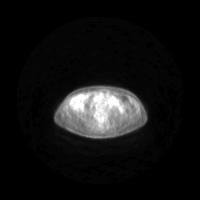
[im 110/220]
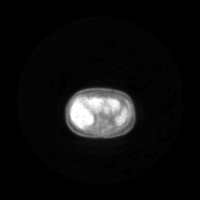
[im 137/220]
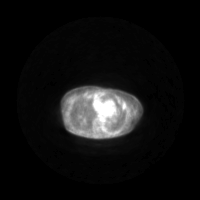
[im 165/220]
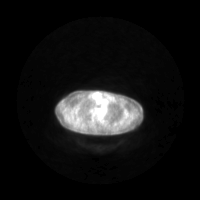
[im 192/220]
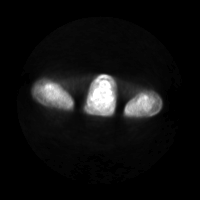
[im 220/220]
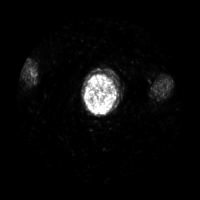

[Series 4: ct wb · axial · 4.0mm · 0.98mm/px · z∈[+184,+1060]mm · 8 of 220 slices shown]
[im 1/220]
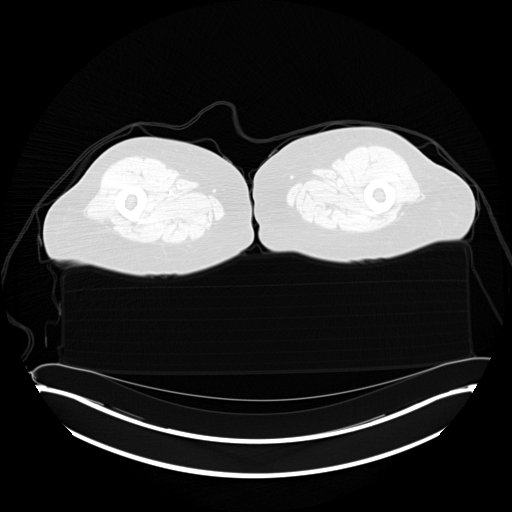
[im 32/220]
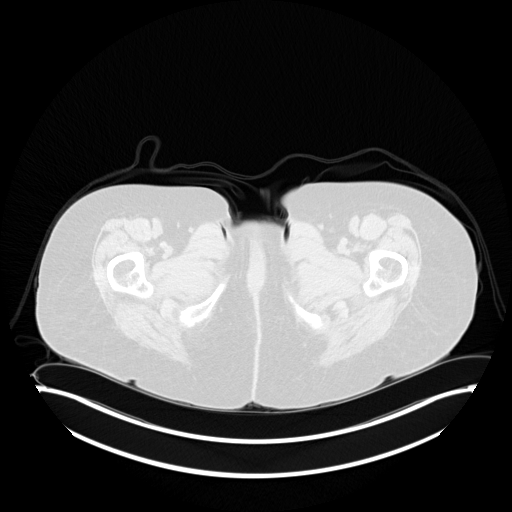
[im 63/220]
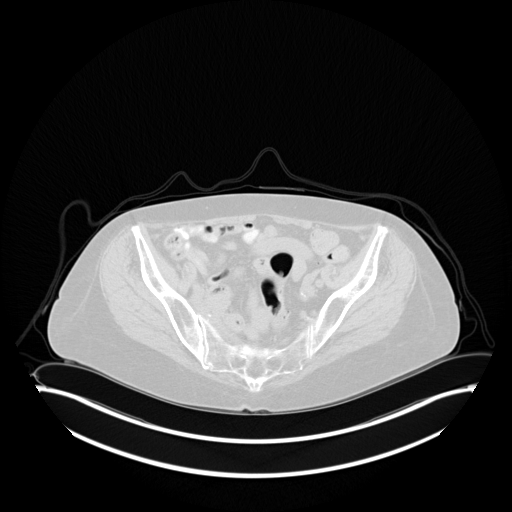
[im 94/220]
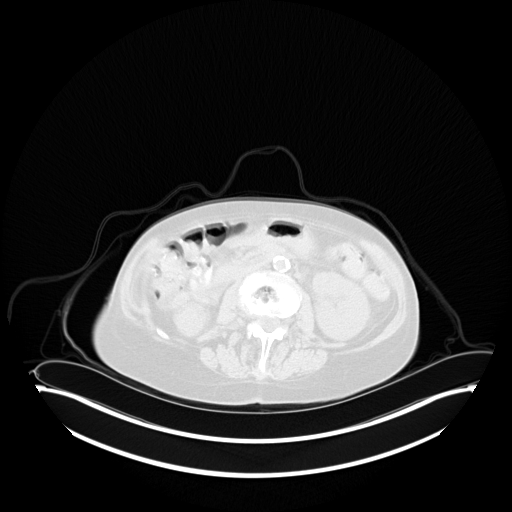
[im 126/220]
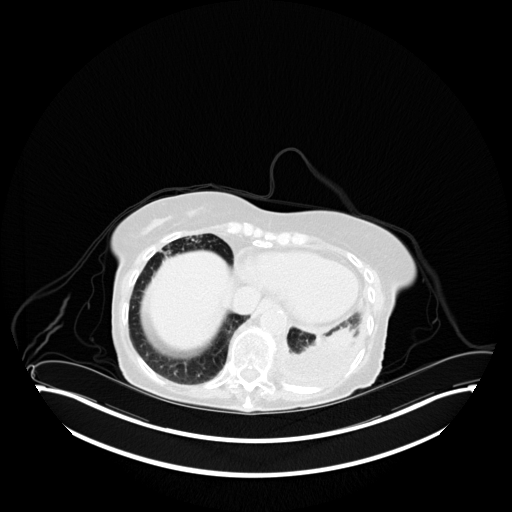
[im 157/220]
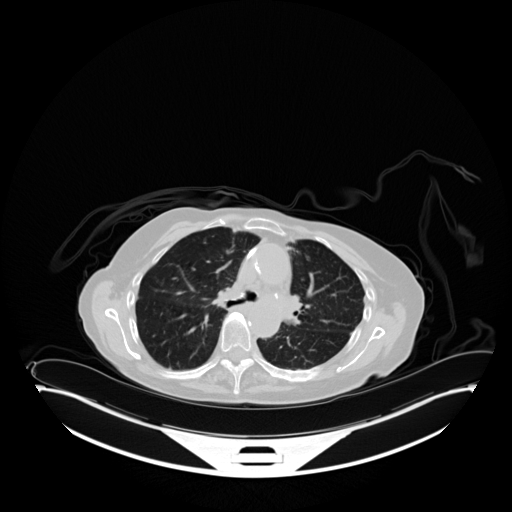
[im 188/220]
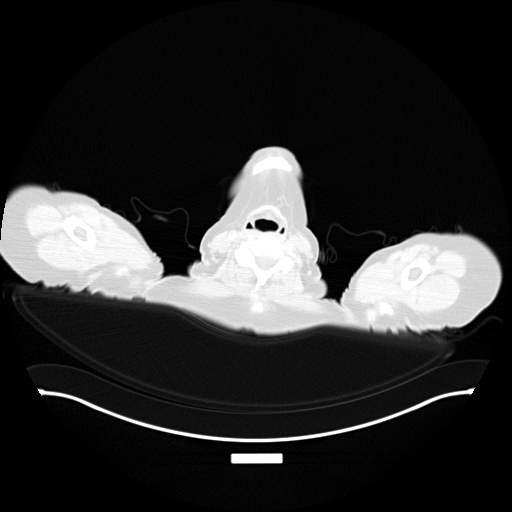
[im 220/220  brain]
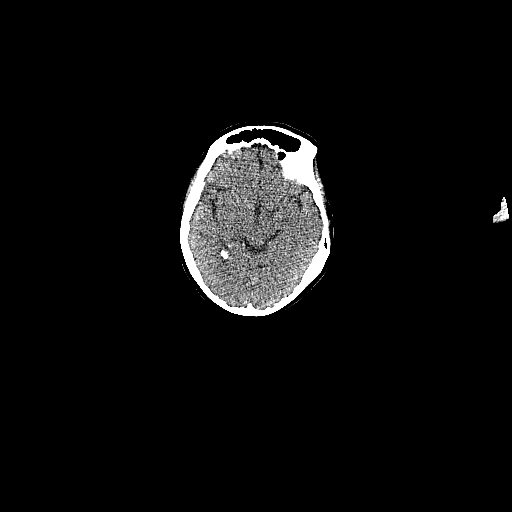

[Series 5: pet wb nac · axial · 4.0mm · 4.07mm/px · z∈[+184,+1060]mm · 8 of 220 slices shown]
[im 1/220]
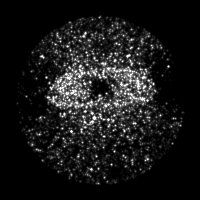
[im 32/220]
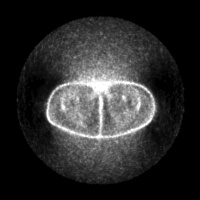
[im 63/220]
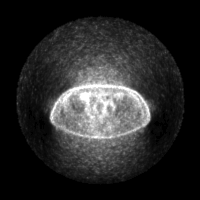
[im 94/220]
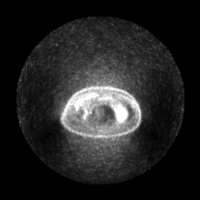
[im 126/220]
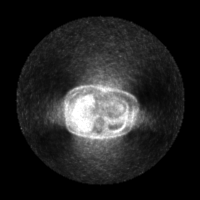
[im 157/220]
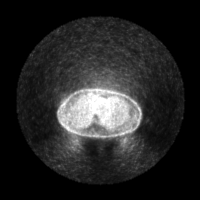
[im 188/220]
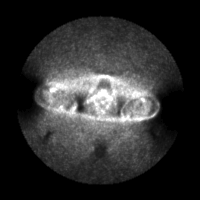
[im 220/220]
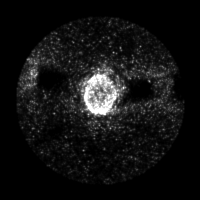

[25 of 25 positions shown; findings below may reference images not displayed]

FINDINGS: The previously highly FDG avid infiltrating mass lesions within the LEFT 
posteromedial extrapleural thoracic space, posterior mediastinum, posterior 
chest wall, quadratus lumborum muscle and spleen currently shows interval 
resolution of abnormal FDG avidity indicating excellent response to therapy. No 
new suspicious foci of activity are found elsewhere. There are no discretely 
enlarged FDG avid lymph nodes or suspicious foci of high level skeletal 
activity. 
The infiltrating inflammatory process in the RIGHT middle lobe and lingula 
persists, relatively unchanged based on the low-dose CT. Both areas are 
associated with relatively low level of FDG avidity showing max SUV of 3.4. To 
be likely evidence of persistent inflammation. 
A low level CT shows persistent small probably loculated LEFT basilar 
pneumothorax and residual pleural thickening. There is secondary compressive 
atelectasis of the posterior base of the LEFT lower lobe. The spleen currently 
appears normal.
IMPRESSION: Excellent positive response to therapy with the previously FDG avid foci of 
lymphoma exhibiting interval complete disappearance of FDG activity.

## 2020-01-14 IMAGING — NM RENAL SCAN WITH LASIX
5 series · 10 of 10 positions shown · non-contrast
Comparison: 12/10/2019 CT chest abdomen and pelvis.

RENAL SCAN WITH LASIX, 01/14/2020 [DATE]: 
CLINICAL INDICATION: B-cell lymphoma on chemotherapy. Left-sided hydronephrosis 
seen on CT from 12/10/2019
TECHNIQUE: The patient was injected intravenously with 9.8 mCi of technetium 99m 
MAG3 and flow function and drainage of the kidneys analyzed on a nuclear 
medicine gamma camera. The patient was given 40 mg of Lasix IV at 10 minutes 
after initial injection and drainage of the kidneys was analyzed.

[Series 2527: mag3 flw · 3.39mm/px · 6 of 103 frames shown]
[frame 9/103]
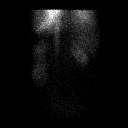
[frame 26/103]
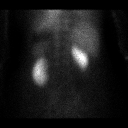
[frame 43/103]
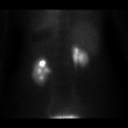
[frame 60/103]
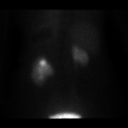
[frame 77/103]
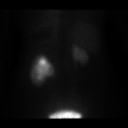
[frame 95/103]
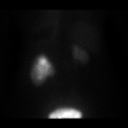

[Series 2528: bladder · 2.26mm/px · 1 of 1 slices shown]
[im 1/1]
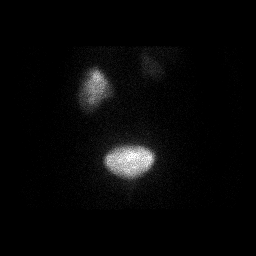

[Series 2529: post · 2.26mm/px · 1 of 1 slices shown]
[im 1/1]
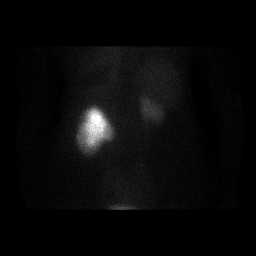

[Series 2531: rpo · 2.26mm/px · 1 of 1 slices shown]
[im 1/1]
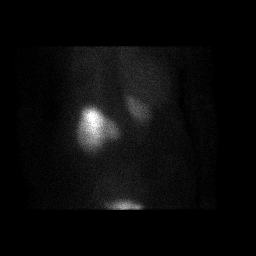

[Series 2532: lpo · 2.26mm/px · 1 of 1 slices shown]
[im 1/1]
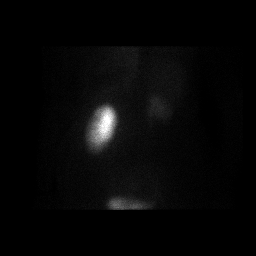

[10 of 10 positions shown; findings below may reference images not displayed]

FINDINGS: Slightly better perfusion of the right kidney relative to the left kidney with 
right kidney with perfusion index of 623 and left kidney perfusion index of 898. 
There is uptake of radiotracer by the kidneys which is symmetric with 49.9% 
uptake in the left kidney and 50.1% uptake in the right kidney. Time to peak was 
7.1 minutes and the left kidney and 7.1 minutes in the right kidney. T1 half 
from peak was 9.2 minutes on the left and 6.3 minutes on the right. This should 
there is normal function and drainage seen in both kidneys with normal symmetric 
drainage after Lasix administration but then diminished drainage of the left 
kidney at 20 minutes with some retention of radiotracer. This is likely related 
to suspected left hydronephrosis possibly related to left ureteropelvic junction 
narrowing. Would recommend follow-up with urologic consultation.
IMPRESSION: Normal initial flow function and drainage of the kidneys. However there is 
diminished drainage of the left kidney on delayed imaging, when this is 
correlated with the dilated left collecting system seen on CT in the left 
collecting system but not involving the left ureter, there may be underlying 
left ureteropelvic junction narrowing, and would recommend urologic 
consultation.

## 2020-03-14 IMAGING — MG MAMMOGRAPHY SCREENING BILATERAL 3D TOMOSYNTHESIS WITH CAD
8 series · 9 of 24 positions shown · non-contrast
Comparison: Comparison is made to the prior exams dating back to 8775 
BREAST DENSITY: (Level C) The breasts are heterogeneously dense, which may 
obscure small masses.

MAMMOGRAPHY SCREENING BILATERAL 3D TOMOSYNTHESIS WITH CAD, 03/14/2020 [DATE]: 
CLINICAL INDICATION: Screening
TECHNIQUE: Digital bilateral mammograms and 3-D Tomosynthesis were obtained. 
These were interpreted both primarily and with the aid of computer-aided 
detection system.

[R MLO]
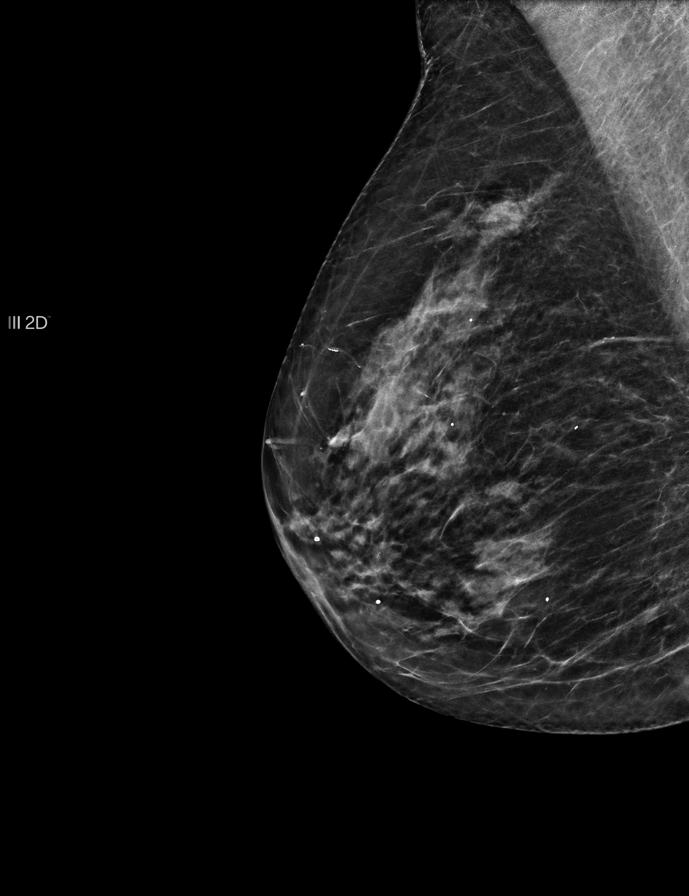

[L MLO]
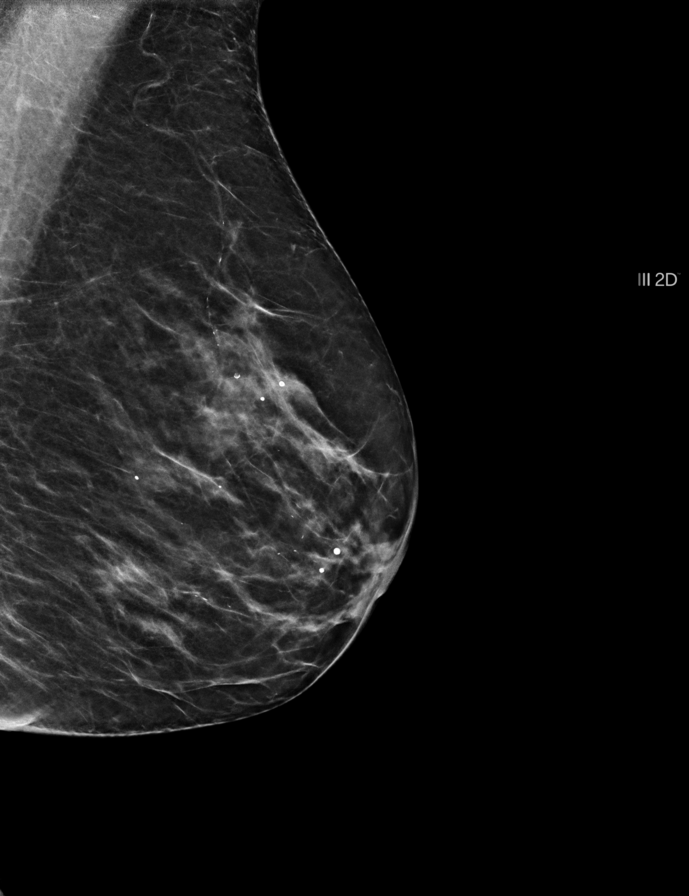

[L CC]
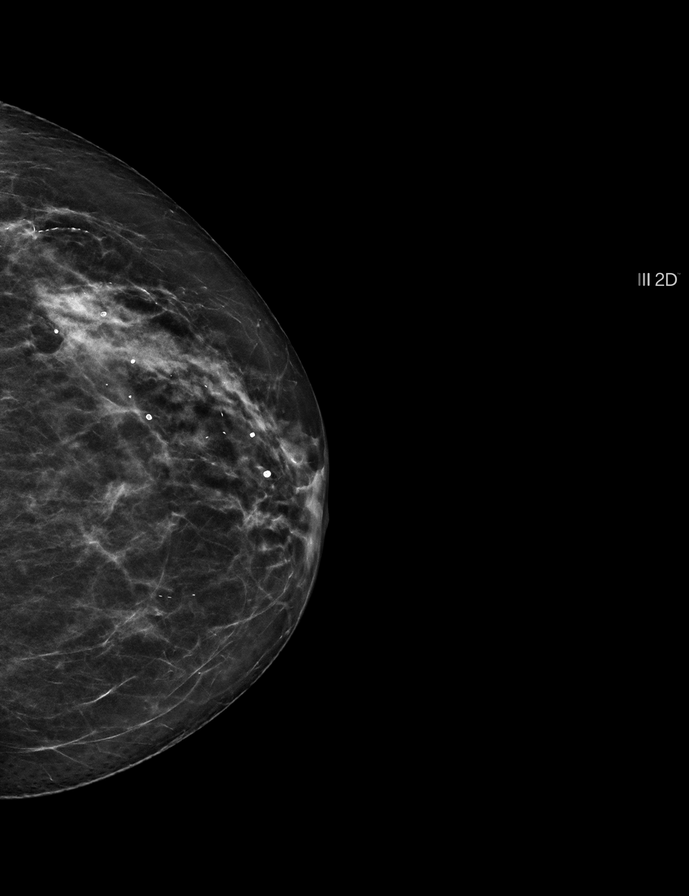

[R CC]
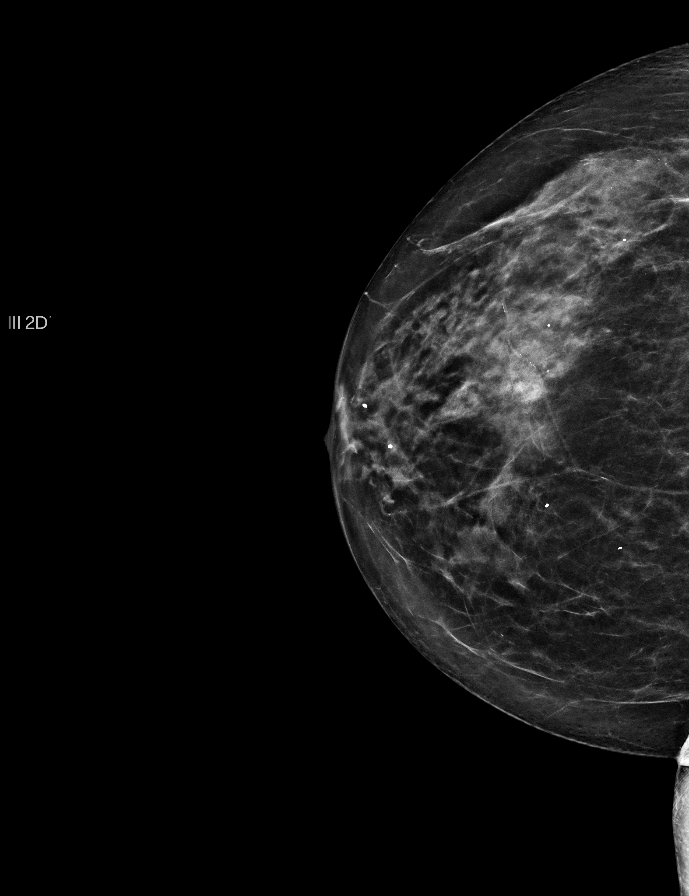

[R MLO tomo · 2 of 50 frames shown]
[frame 17/50]
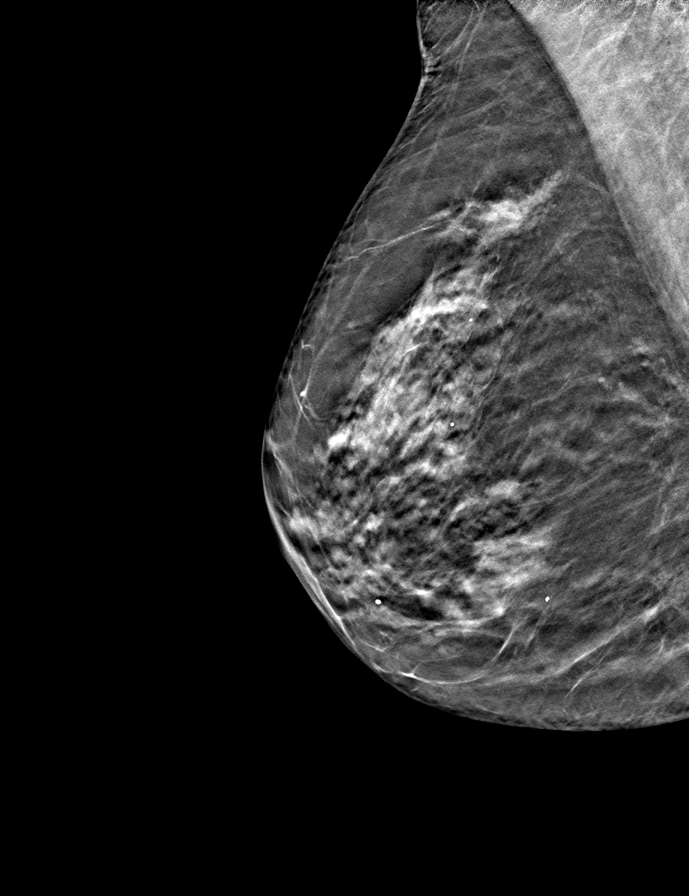
[frame 25/50]
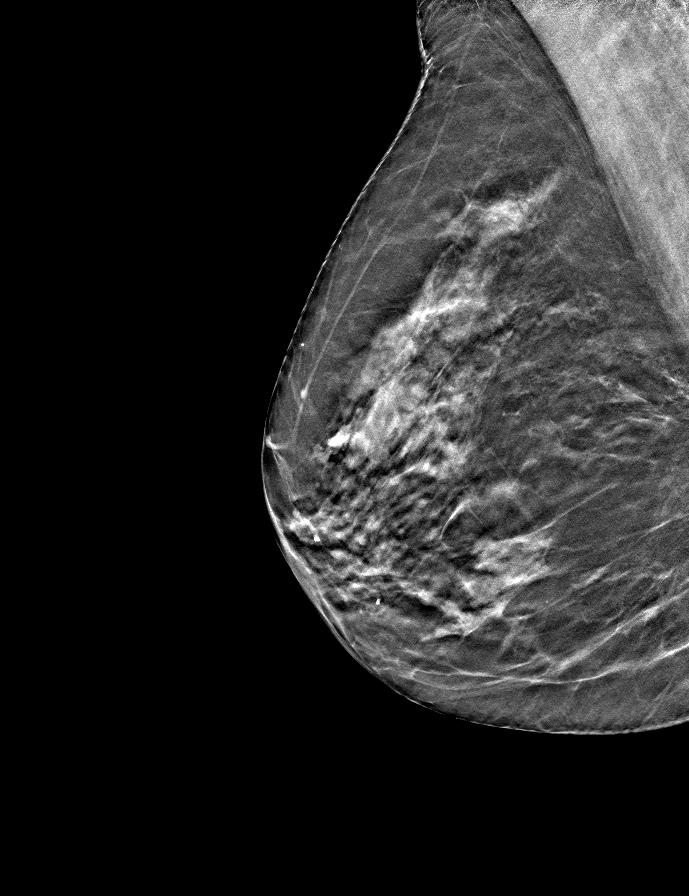

[R CC tomo · tomo slice 25/48.0]
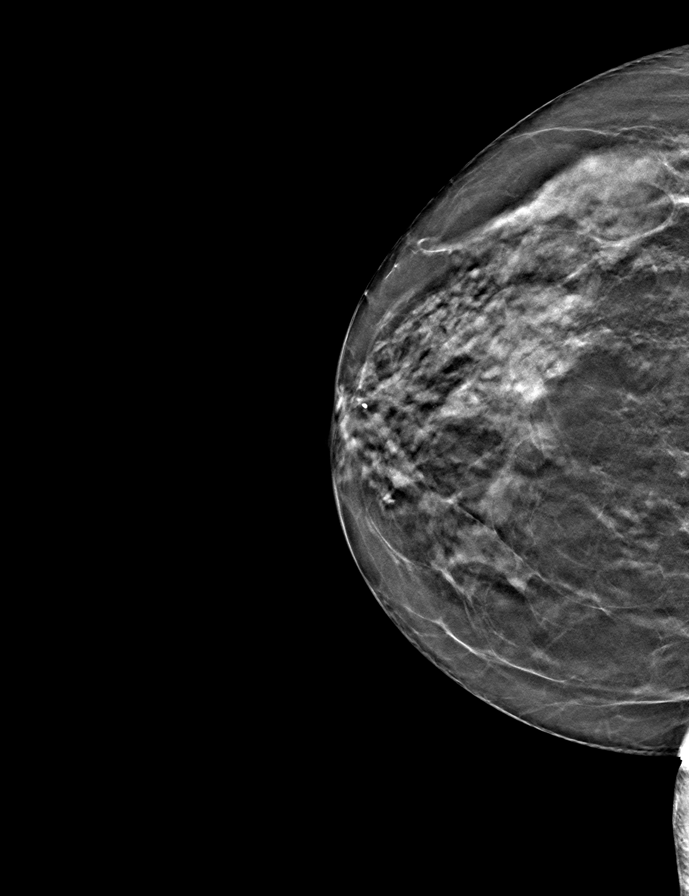

[L MLO tomo · tomo slice 27/54.0]
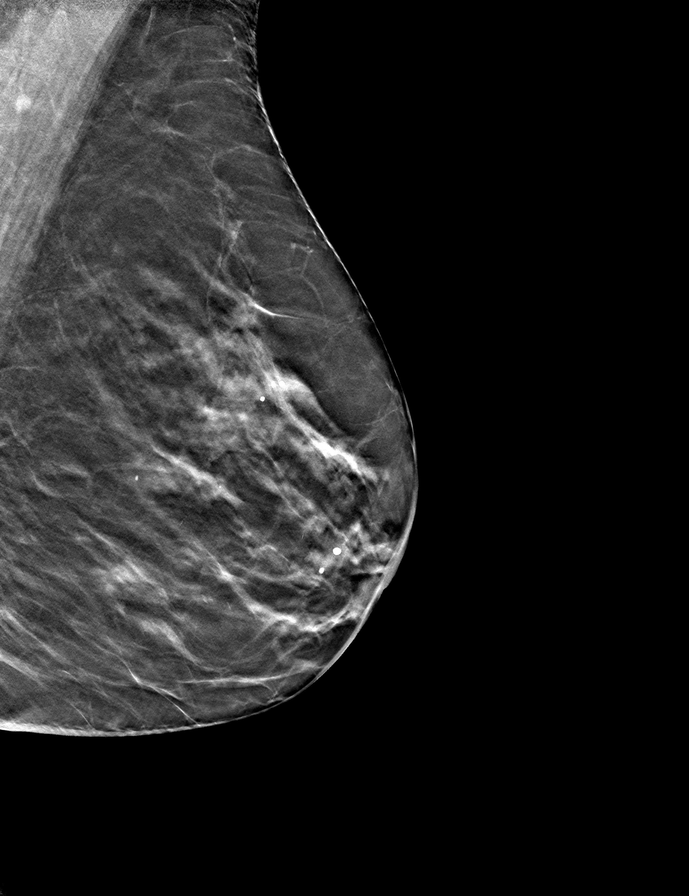

[L CC tomo · tomo slice 27/52.0]
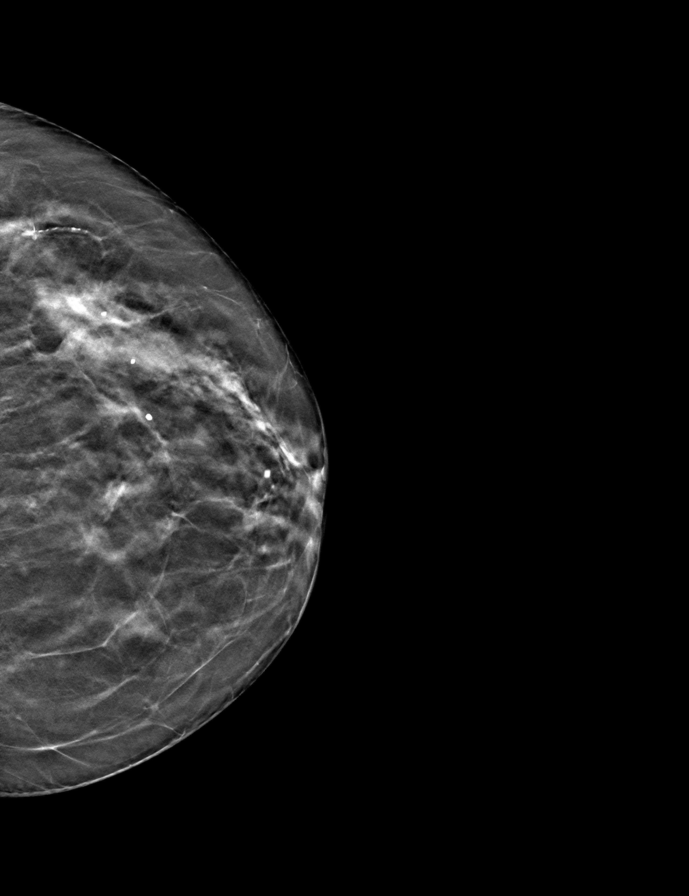

[9 of 24 positions shown; findings below may reference images not displayed]

FINDINGS: There is no evidence of mass or malignant appearing microcalcifications 
seen on today's examination. No skin thickening is identified. There is thought 
to be an overall stable mammographic appearance.
IMPRESSION: ( BI-RADS 2) Benign findings. Routine mammographic follow-up is recommended.

## 2020-05-10 IMAGING — NM GASTRIC EMPTYING SCAN
5 series · 10 of 10 positions shown · non-contrast
Comparison: none

GASTRIC EMPTYING SCAN, 05/10/2020 [DATE]: 
CLINICAL INDICATION:  Pain. Bloating. Left upper quadrant pain.
TECHNIQUE: The patient was given a breakfast  of scrambled egg is with white 
toast and 0.6 mCi of Technetium AAm-Xulfur colloid along with 4 oz of water over 
10 minutes. 
Scans of the stomach were obtained as follows: Immediate, 1 hour, 2 hours, 3 
hours, and 4 hours. Results were compared to standards established by the 
consensus report from that ANMS and the FATIFATI, 9449.

[Series 2143: ant/post immed · 4.52mm/px · 2 of 2 frames shown]
[frame 1/2]
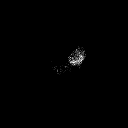
[frame 2/2]
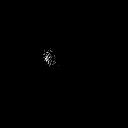

[Series 2146: ant/post 1 hr · 4.52mm/px · 2 of 2 frames shown]
[frame 1/2]
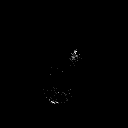
[frame 2/2]
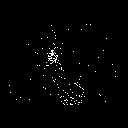

[Series 2150: ant/post 2 hr · 4.52mm/px · 2 of 2 frames shown]
[frame 1/2]
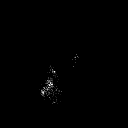
[frame 2/2]
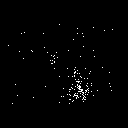

[Series 2154: ant/post 3 hr · 4.52mm/px · 2 of 2 frames shown]
[frame 1/2]
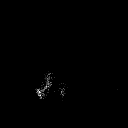
[frame 2/2]
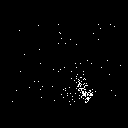

[Series 2158: ant/post 4 hr · 4.52mm/px · 2 of 2 frames shown]
[frame 1/2]
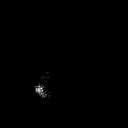
[frame 2/2]
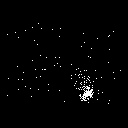

[10 of 10 positions shown; findings below may reference images not displayed]

FINDINGS: TIME                    EMPTYING                          NORMAL 
     1 hour                     49%                             (< 30%) 
     2 hours                   51%                             (> 60%) 
     3 hours                   61%                              (> 70%) 
     8hours                    60%                              (> 90%)
IMPRESSION: Decreased gastric emptying over 4 hours.

## 2020-05-19 IMAGING — CT CT CHEST/ABDOMEN AND PELVIS WITH CONTRAST  (FCS)
1 of 4 series · 12 of 32 positions shown, 18 images · IV contrast (isovue)
Comparison: Comparison was made to the prior exam(s) within the last 12 months 
dated  12/29/2019 PET/CT

CT CHEST/ABDOMEN AND PELVIS WITH CONTRAST  (FCS), 05/19/2020 [DATE]: 
(Films were taken at [facility].) 
CLINICAL INDICATION:  Diffuse large B-cell lymphoma. Prior chemotherapy. 
A search for DICOM formatted images was conducted for prior CT imaging studies 
completed at a non-affiliated media free facility.
TECHNIQUE: The chest, abdomen and pelvis were scanned with 100 ml of 
intravenous Isovue 300. Routine MPR reconstructions were performed.

[Series 2: cap w · axial · 0.97mm/px · z∈[+598,+1111]mm · 12 of 203 slices shown, 18 images]
[im 16/203  soft-tissue]
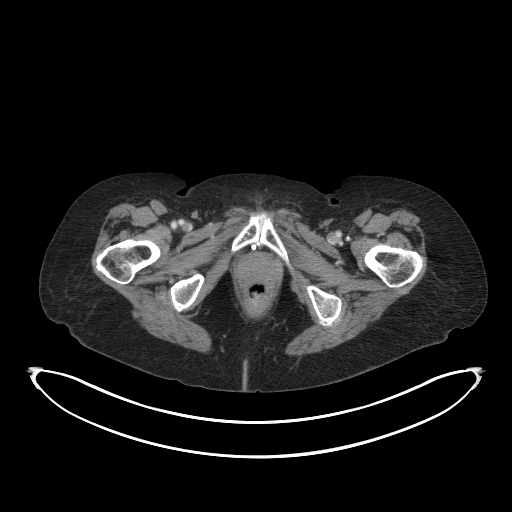
[im 16/203  bone]
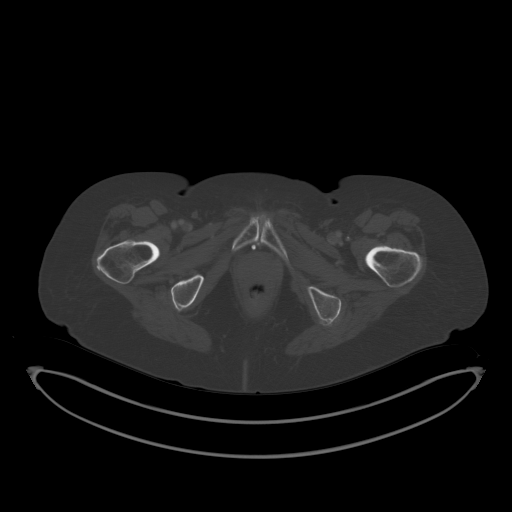
[im 32/203  soft-tissue]
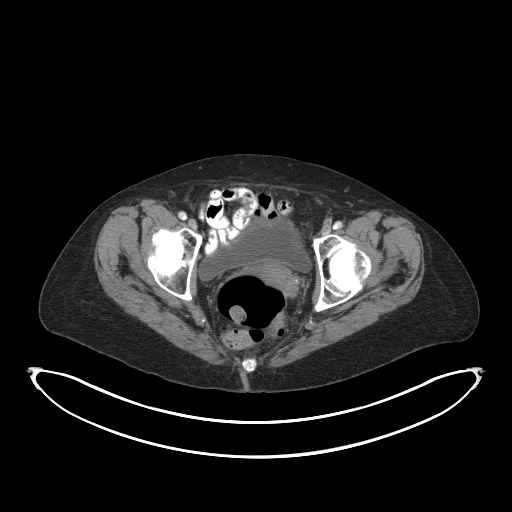
[im 47/203  soft-tissue]
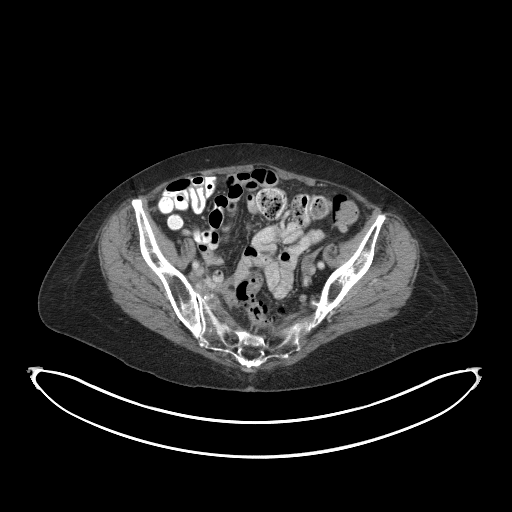
[im 63/203  soft-tissue]
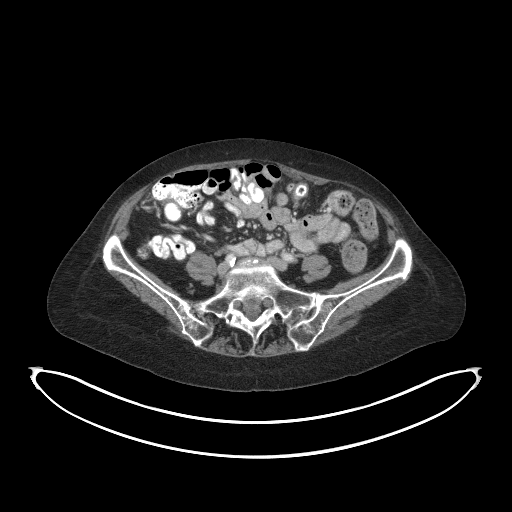
[im 78/203  soft-tissue]
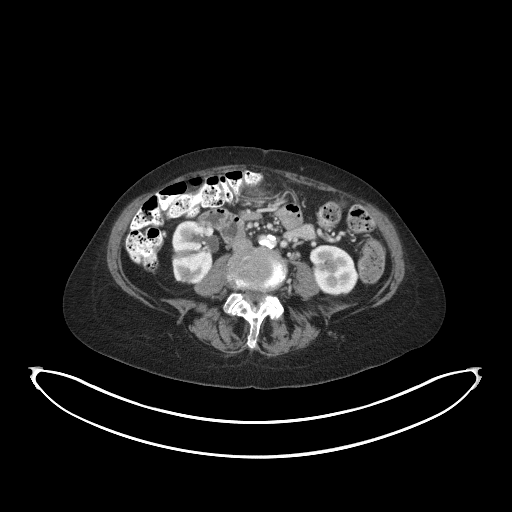
[im 94/203  soft-tissue]
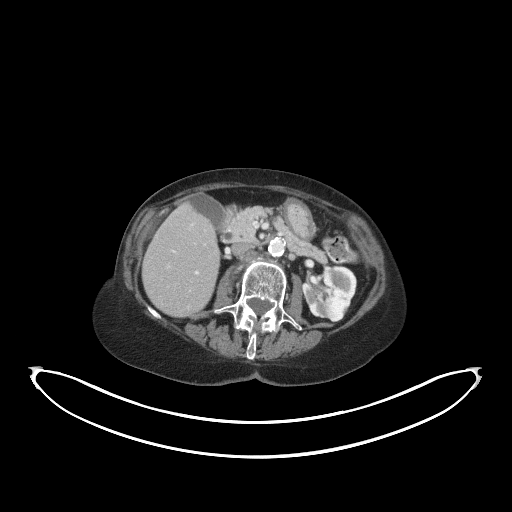
[im 109/203  soft-tissue]
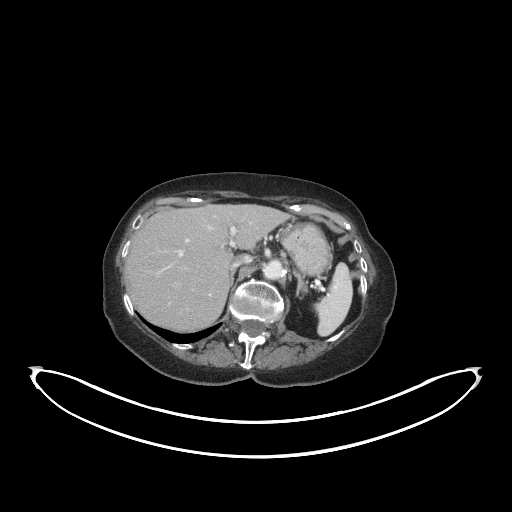
[im 125/203  soft-tissue]
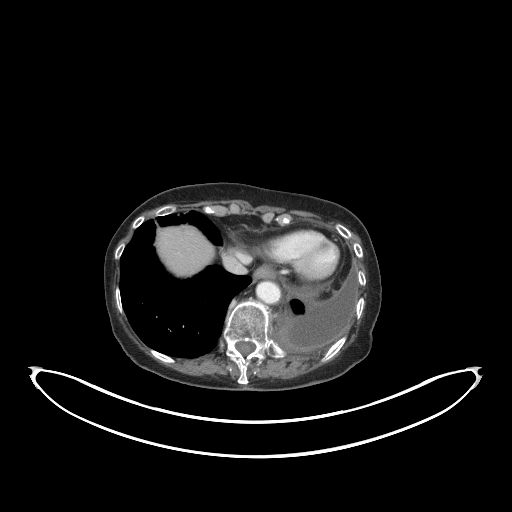
[im 140/203  soft-tissue]
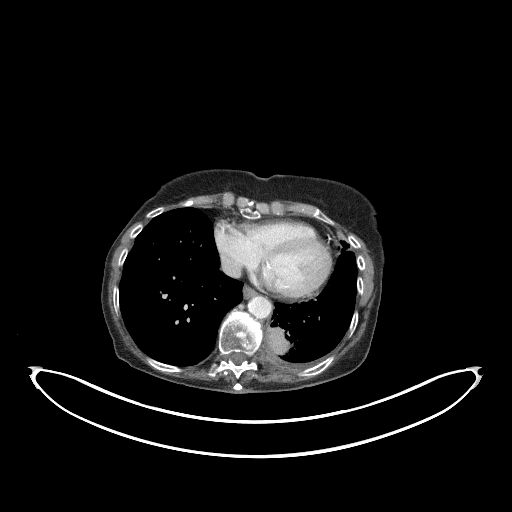
[im 140/203  lung]
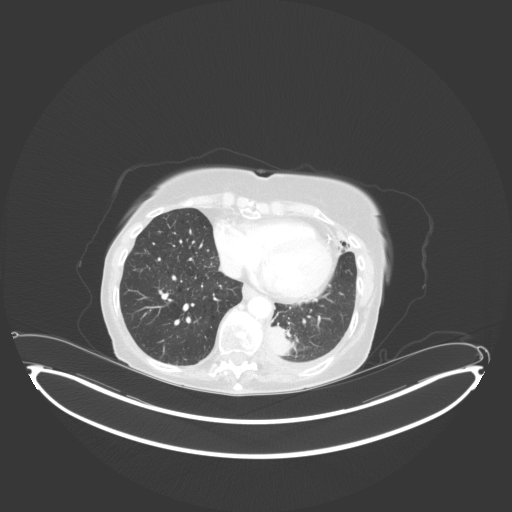
[im 140/203  bone]
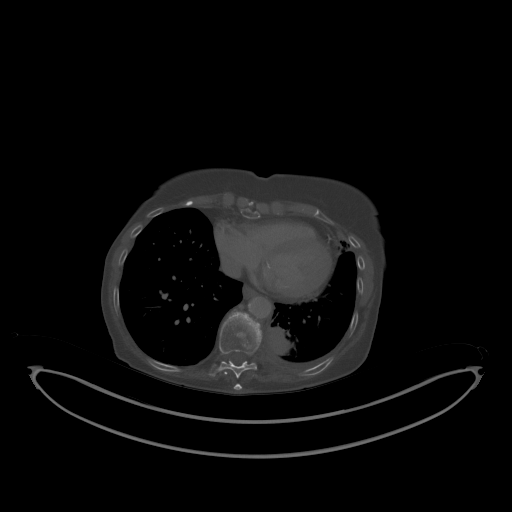
[im 156/203  soft-tissue]
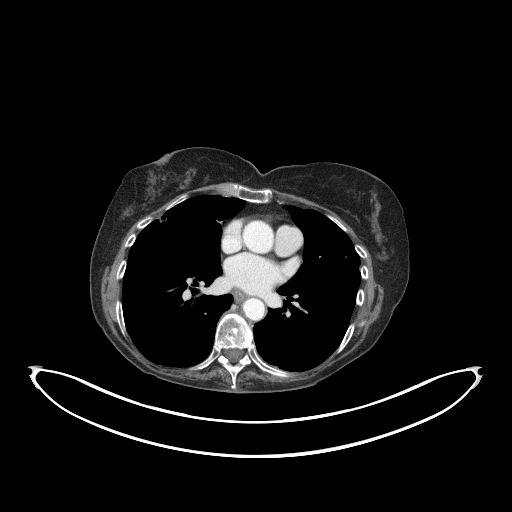
[im 156/203  lung]
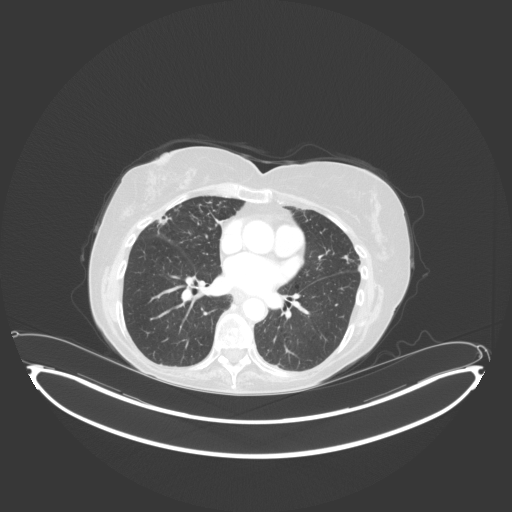
[im 171/203  soft-tissue]
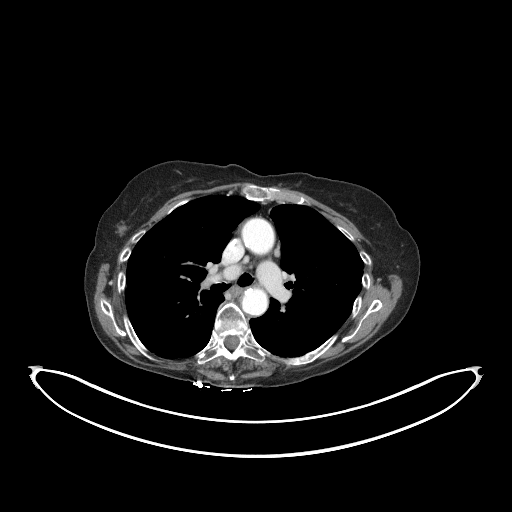
[im 171/203  lung]
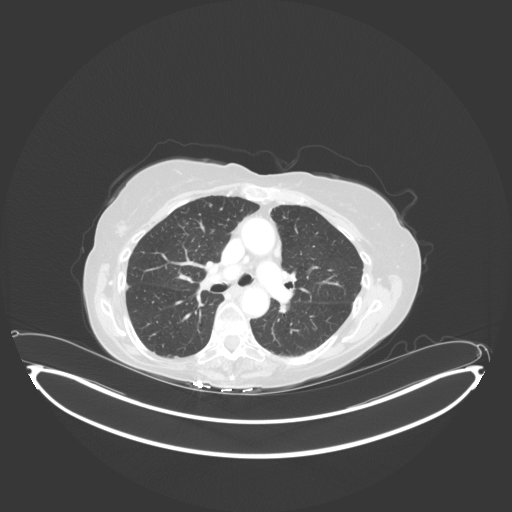
[im 187/203  soft-tissue]
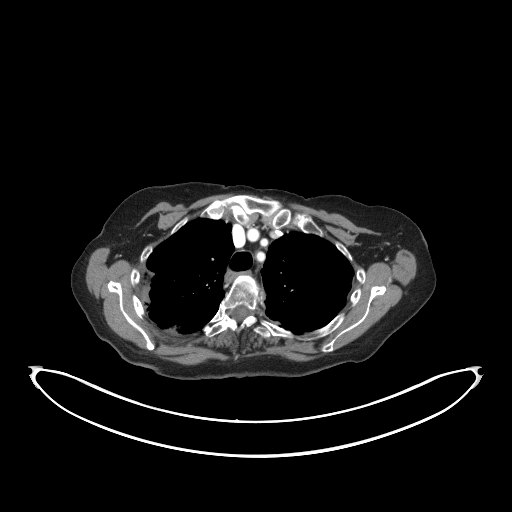
[im 187/203  lung]
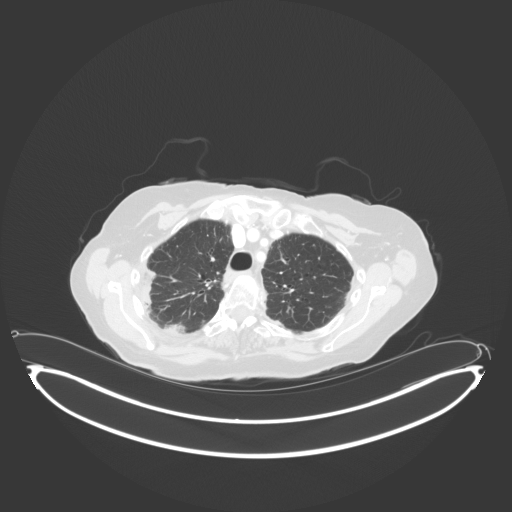

[12 of 32 positions shown; findings below may reference images not displayed]

FINDINGS: LUNGS/PLEURA: Stable multifocal RML and lingular small nodular opacities and 
cylindrical bronchiectasis: atypical mycobacterium is a consideration. Biapical 
fibrosis. Stable multifocal pleural thickening, most marked along the RUL. 
Stable small left pleural effusion with adjacent LLL atelectasis. No 
pneumothorax. 
HEART: Heart is normal in size and configuration. 
MEDIASTINUM: No mass. 
AORTA/VESSELS: Heterogeneous opacification of the bilateral common femoral and 
external iliac veins, worrisome for nonocclusive thrombus. Atherosclerosis. No 
aneurysm. Left subclavian Port-A-Cath. 
PULMONARY ARTERIES: No pulmonary filling defects. Normal in caliber. 
LYMPH NODES: No lymphadenopathy. 
LIVER/BILE DUCTS: 0.6 cm superior hepatic cyst. No mass or biliary dilatation. 
GALLBLADDER: No cholelithiasis or wall thickening. 
SPLEEN: Normal in size. 
PANCREAS: Normal. 
ADRENAL GLANDS: Normal. 
KIDNEYS: Stable mild left hydronephrosis to the level of the UPJ without 
identifiable calculus or mass. Multifocal bilateral renal scarring and mild 
right renal atrophy. 
STOMACH/BOWEL: No bowel wall thickening or obstruction. 
MESENTERY/PERITONEAL SPACE: No mass, free fluid or pneumoperitoneum. 
PELVIC ORGANS: Normal. 
MUSCULOSKELETAL: Old left third, fourth and 12th rib fractures. Stable left 
posterior seventh rib mildly sclerotic elongated lesion with expansile 
remodeling. Degenerative change and mild thoracolumbar S-shaped curve. No lytic 
or blastic lesions.
IMPRESSION: 1.  Heterogeneous opacification of the bilateral common femoral and external 
iliac veins, worrisome for nonocclusive thrombus: Bilateral leg duplex Doppler 
sonogram may be helpful for further evaluation. 
2.  No splenomegaly or adenopathy. 
3.  Stable mild left hydronephrosis to the level of the UPJ without identifiable 
calculus or mass. 
4.  Stable small left pleural effusion with adjacent LLL atelectasis. 
5.  Stable additional findings. 
RADIATION DOSE REDUCTION: All CT scans are performed using radiation dose 
reduction techniques, when applicable. Technical factors are evaluated and 
adjusted to ensure appropriate moderation of exposure. Automated dose management 
technology is applied to adjust the radiation doses to minimize exposure while 
achieving diagnostic quality images.

## 2020-08-23 IMAGING — CT CT CHEST/ABDOMEN AND PELVIS WITH CONTRAST  (FCS)
1 of 3 series · 12 of 32 positions shown, 17 images · IV contrast (agent unspecified)
Comparison: Comparison was made to the prior exam(s) within the last 12 months 
CT 05/19/2020 and 12/10/2019. PET scan of 12/29/2019 was also reviewed.

CT CHEST/ABDOMEN AND PELVIS WITH CONTRAST  (FCS), 08/23/2020 [DATE]: 
(Films were taken at [facility].) 
CLINICAL INDICATION:  B-cell lymphoma status post chemotherapy and radiation 
A search for DICOM formatted images was conducted for prior CT imaging studies 
completed at a non-affiliated media free facility.
TECHNIQUE: The region of interest was scanned with 100mL IV contrast on a high 
resolution CT scanner.  Routine MPR reconstructions were performed.

[Series 2: cap w · axial · 0.95mm/px · z∈[+716,+1319]mm · 12 of 229 slices shown, 17 images]
[im 14/229  soft-tissue]
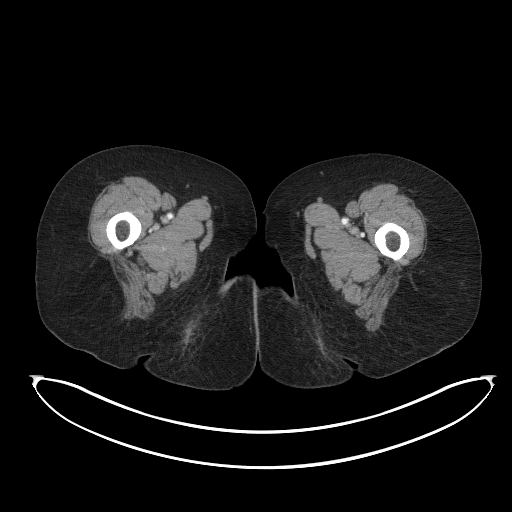
[im 14/229  bone]
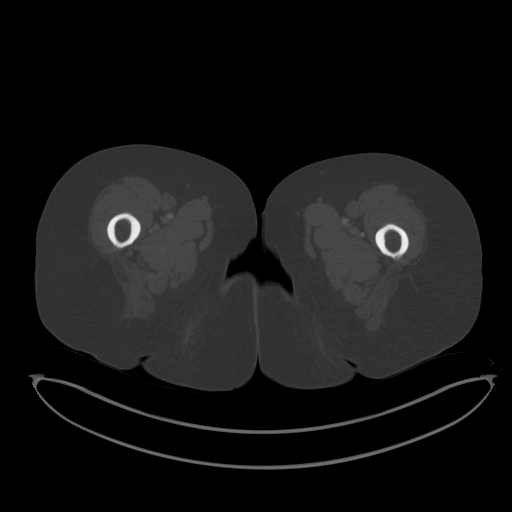
[im 41/229  soft-tissue]
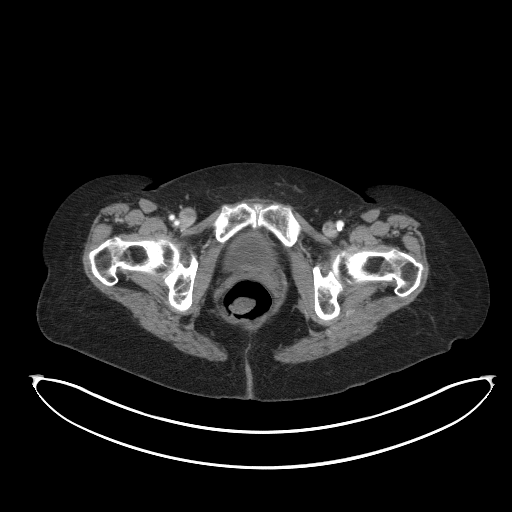
[im 54/229  soft-tissue]
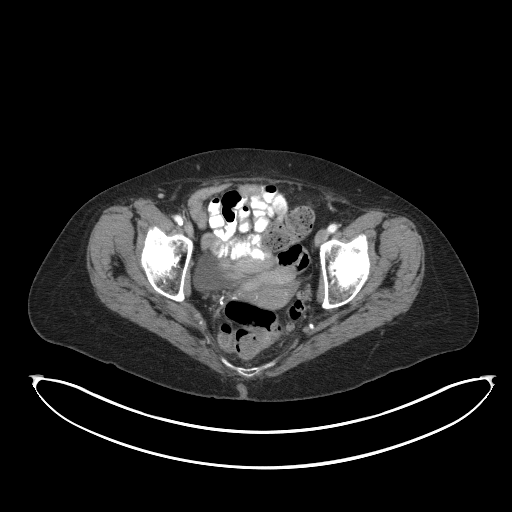
[im 81/229  soft-tissue]
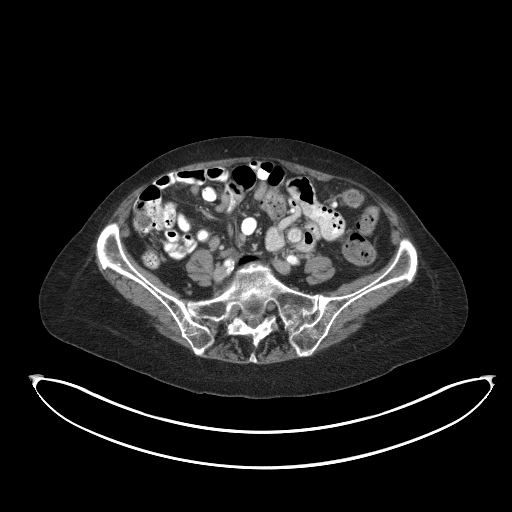
[im 94/229  soft-tissue]
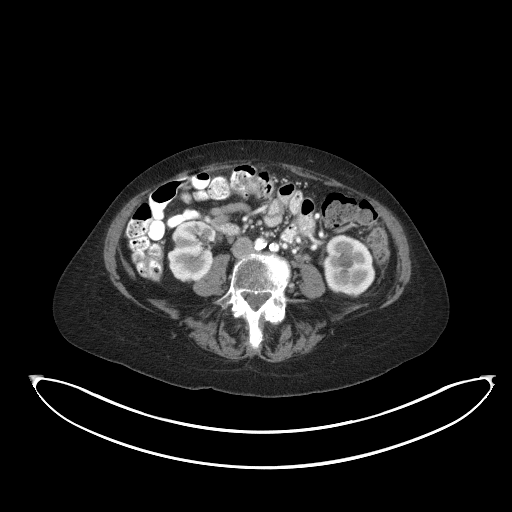
[im 121/229  soft-tissue]
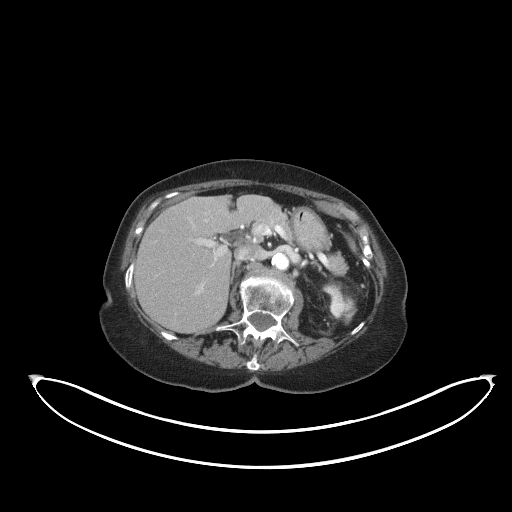
[im 135/229  soft-tissue]
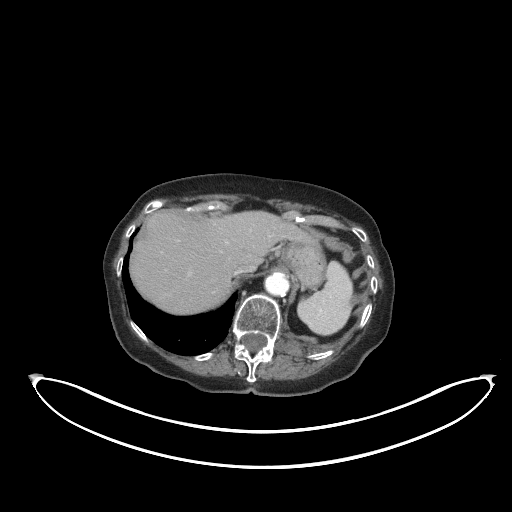
[im 148/229  soft-tissue]
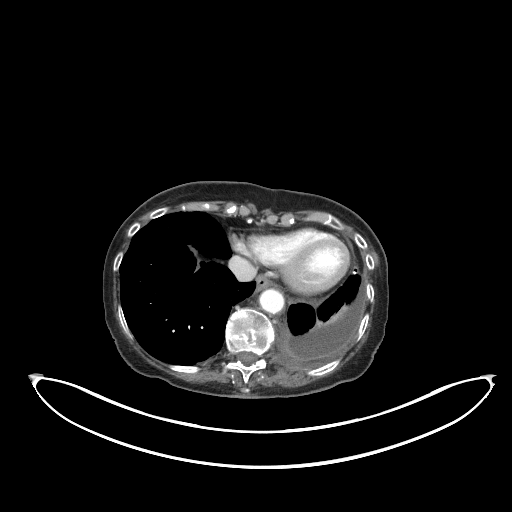
[im 175/229  soft-tissue]
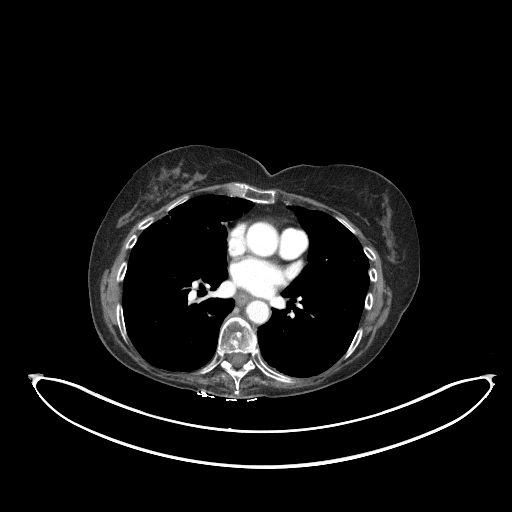
[im 175/229  lung]
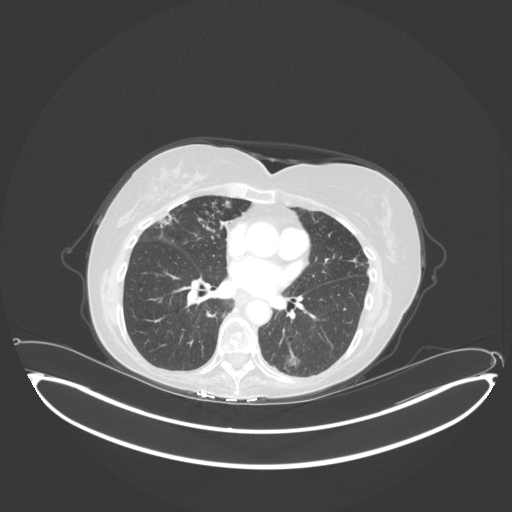
[im 175/229  bone]
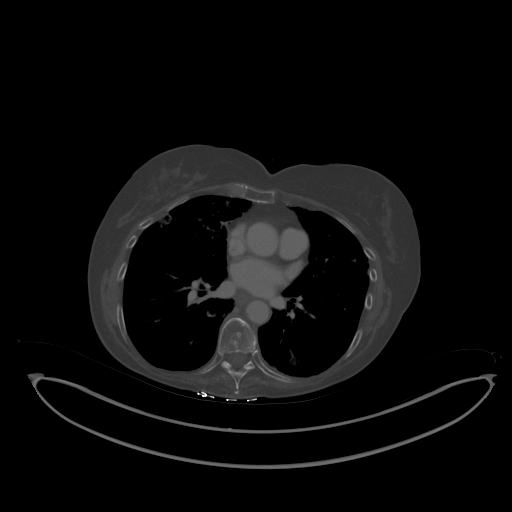
[im 188/229  soft-tissue]
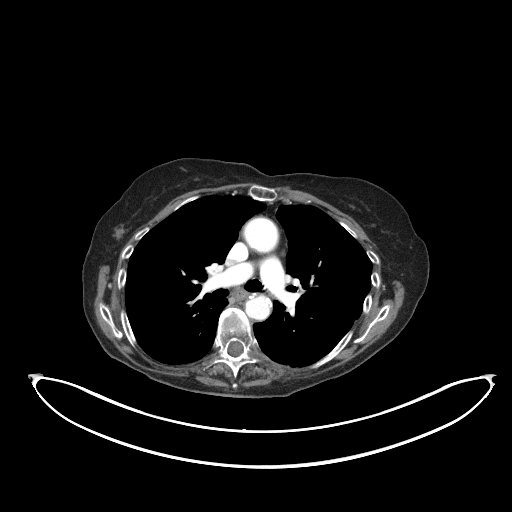
[im 188/229  lung]
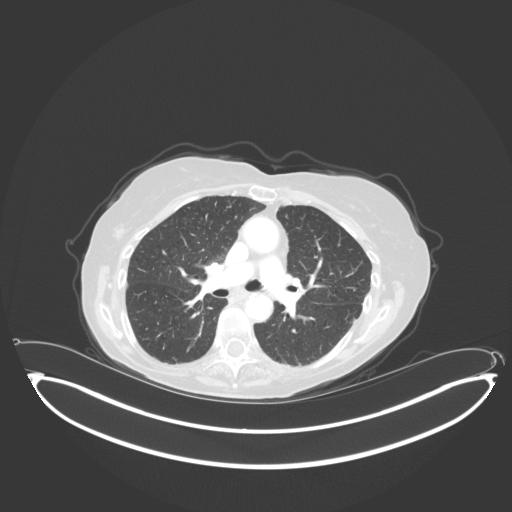
[im 202/229  lung]
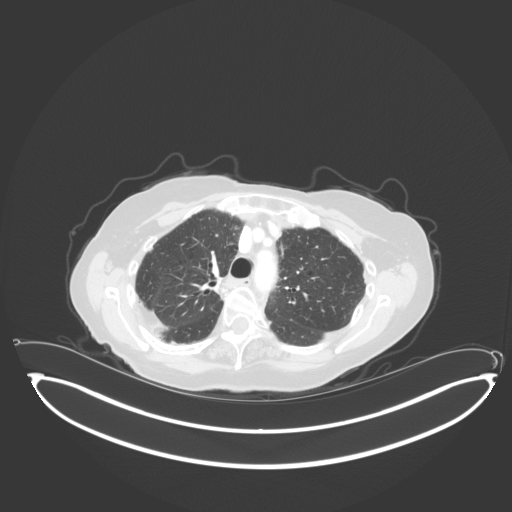
[im 215/229  soft-tissue]
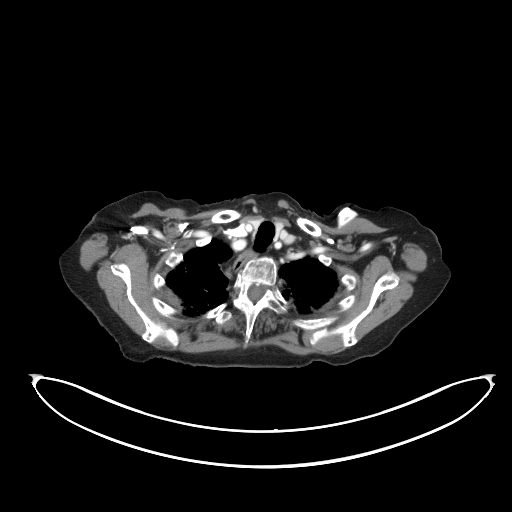
[im 215/229  lung]
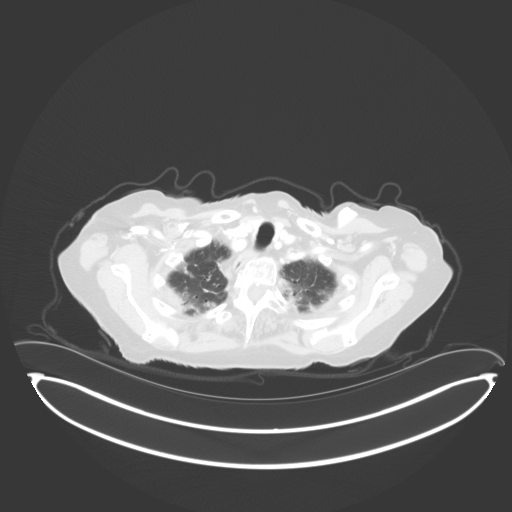

[12 of 32 positions shown; findings below may reference images not displayed]

FINDINGS: LUNGS AND PLEURA:  Stable nodular bronchiectasis involving the inferior aspect 
of the right middle lobe. There is however a new area of nodular consolidation 
along the superior aspect of this process measuring 1.0 x 0.9 cm. This could be 
postinflammatory, however given the patient's clinical history recurrent 
lymphoma involving the lungs be difficult to exclude. Nodular fibrotic changes 
in the lung apices remains stable. Emphysema. There is a new rounded area of 
groundglass change with mild central consolidation posteriorly in the left lower 
lobe measuring 3.5 cm in transverse diameter. The consolidative volume loss in 
the left lower lobe posteriorly as well as the pleural thickening and pleural 
effusion remain unchanged and may reflect posttreatment change. 
MEDIASTINUM:  No adenopathy. Normal heart size. No pericardial effusion. 
CHEST WALL/AXILLA: No mass or adenopathy. Left chest wall port with catheter tip 
mid SVC. 
HEPATOBILIARY: Subcentimeter hepatic cysts. No biliary dilatation. No 
gallstones. 
SPLEEN: Normal in size. 
PANCREAS: No ductal dilatation or mass. 
ADRENALS: No mass. 
GENITOURINARY: Moderate hydronephrosis with abrupt transition at the UPJ has 
significantly progressed since the prior CT. Appears to show the transition as 
the ureter crosses behind the gonadal vessels. No definite solid mass or stone 
is visualized. Moderate cortical scarring involving both kidneys. No bladder 
mass. 
LYMPH NODES: No adenopathy. 
STOMACH, SMALL BOWEL AND COLON: Diverticular disease of the colon without 
diverticulitis. Small esophageal hiatal hernia. 
VASCULAR STRUCTURES: Venous varix right common femoral vein is unchanged. 
MUSCULOSKELETAL: Moderate degenerative changes lumbar spine facet joints. 
ADDITIONAL FINDINGS: None,
IMPRESSION: 1. There is a new small nodular area of consolidation involving the right middle 
lobe anteriorly. This overlies superior to the area of chronic nodular 
bronchiectasis which has remained stable. This could reflect an acute 
inflammatory process however this should be closely monitored in light of the 
patient's clinical history. 
2. There is a new area of groundglass change with some consolidation noted in 
the left lower lobe measuring up to 3.5 cm in transverse diameter. This may also 
be postinflammatory but should be monitored with a short-term follow-up. 
3. The chronic consolidative volume loss in the left lower lobe with pleural 
thickening and chronic pleural effusion remains stable. 
4. Emphysematous changes of the lungs with scattered scarring. 
5. No adenopathy or splenomegaly. 
6. Moderate left-sided hydronephrosis has significantly increased since prior 
CT. There is abrupt transition point at the UPJ without discrete mass or stone. 
The ureter appears to cross behind the gonadal vessels at the transition point. 
See additional stable incidental findings above. 
RADIATION DOSE REDUCTION: All CT scans are performed using radiation dose 
reduction techniques, when applicable.  Technical factors are evaluated and 
adjusted to ensure appropriate moderation of exposure.  Automated dose 
management technology is applied to adjust the radiation doses to minimize 
exposure while achieving diagnostic quality images.

## 2020-11-10 IMAGING — PT PET CT SCAN TUMOR IMAGING SKULL TO THIGH  (FCS)
1 of 2 series · 1 of 25 positions shown · non-contrast
Comparison: none

PET CT SCAN TUMOR IMAGING SKULL TO THIGH  (FCS), 11/10/2020 [DATE]: 
CLINICAL INDICATION:  Diffuse large B-cell lymphoma.
TECHNIQUE: A dose of 14.0 millicuries of 18-FDG was administered intravenously 
and skull to thigh PET scanning was performed at 60 minutes. Tomographic scans 
were reconstructed in axial, coronal, and sagittal projections. The data was 
reconstructed into a three-dimensional volume rendered images and reviewed in a 
rotational cine loop. Serum blood glucose at the time of injection was 89 mg/dl. 
COMPARISON EXAMINATION:  CT chest abdomen pelvis August 23, 2020. The most 
recent PET scan was performed on December 29, 2019.

[Series 4: ct wb · axial · 4.0mm · 0.98mm/px · 1 of 220 slices shown]
[im 220/220  brain]
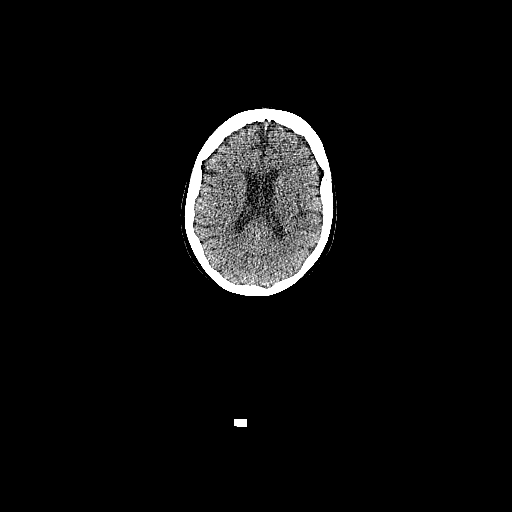

[1 of 25 positions shown; findings below may reference images not displayed]

FINDINGS: There is increasing FDG avid ill-defined nodularity throughout the RIGHT middle 
lobe with a morphologic appearance suggesting inflammatory disease. Several of 
the small nodules exhibit FDG avidity showing max SUV of up to 9.9. 
There is increasing consolidation throughout the posterior aspect of the LEFT 
lower lobe since the previous examination from August 23, 2020 showing FDG 
avidity up to max SUV of 4.8. This is suspected of being inflammatory. 
There is a persistent stable encapsulated small LEFT basilar pleural effusion. 
There is thickening of the pleural space along the posterior base of the LEFT 
inferior thorax unchanged from the recent prior exam from August 23, 2020. The 
thickened pleura exhibits only mild FDG avidity with a max of 2.7. Minor FDG 
avidity associated with chronic changes in the inferior lingula and posterior 
apex of the RIGHT upper lobe are incidentally noted. 
No FDG avid lymph nodes are found in the neck, chest, abdomen or pelvis. The 
spleen is normal in size and not FDG avid. No focal splenic lesion is visible on 
the low-dose CT. No clinically significant focal skeletal FDG avidity is found. 
There is no hepatic FDG avidity. The PET images are otherwise unremarkable. 
There are incidental findings on the low-dose CT. Multiple bilateral renal 
cortical scars are present. A small hyperdense hemorrhagic cyst in the lower 
pole of the LEFT kidney is present. The LEFT hydronephrosis has essentially 
resolved since the prior exam from August 23, 2020. Severe atherosclerotic 
calcifications are present. Diverticulosis of the colon is incidentally noted. 
There is a LEFT-sided veniport.
IMPRESSION: 1. As compared to the prior CT of the chest from August 23, 2020 and the PET/CT 
from December 29, 2019 increasing irregular nodularity has developed in the RIGHT 
middle lobe with some of the nodularity exhibited elevated FDG activity. The 
morphologic appearance of this process however is strongly suggestive of 
inflammatory disease. 
2. There has been interval development of consolidation throughout the posterior 
aspect of the LEFT lower lobe, not previously present even as recently as 
August 23, 2020 exhibiting max SUV of up to 4.8, probably inflammatory but 
tumor infiltration of the lung is not excluded since this was the location of 
the most recent involvement by neoplasm. 
3. On the most recent prior exam from August 23, 2020 there was LEFT 
hydronephrosis which appears to have exhibited interval resolution. 
4. Otherwise no significant change. There are stable chronic changes elsewhere 
in the lungs. There is a stable encapsulated small LEFT basilar pleural effusion 
and adjacent pleural thickening all of which is stable. 
5. No FDG avid or enlarged lymph nodes are found in the neck, chest, abdomen or 
pelvis. The spleen is normal and not FDG avid. No suspicious focal skeletal or 
hepatic FDG avidity are found.

## 2021-01-05 IMAGING — CT CT CHEST WITHOUT CONTRAST
2 of 3 series · 14 of 36 positions shown, 17 images · non-contrast
Comparison: PET/CT dated 11/10/2020, CT chest abdomen pelvis dated 08/23/2020

CT CHEST WITHOUT CONTRAST, 01/05/2021 [DATE]: 
CLINICAL INDICATION:  Diffuse large B-cell lymphoma, nodes at multiple sites, 
follow-up to PET/CT from 11/10/2020 
A search for DICOM formatted images was conducted for prior CT imaging studies 
completed at a non-affiliated media free facility.
TECHNIQUE: The chest was scanned from base of neck through the lung bases 
without contrast on a high resolution low dose CT scanner. Routine MPR and MIP 
3D renderings were reconstructed on an independent workstation with concurrent 
physician supervision.

[Series 2: chest w/o 2.0 i31s 3 · axial · non-contrast · 0.66mm/px · z∈[-314,-26]mm · 11 of 172 slices shown, 14 images]
[im 14/172  mediastinal]
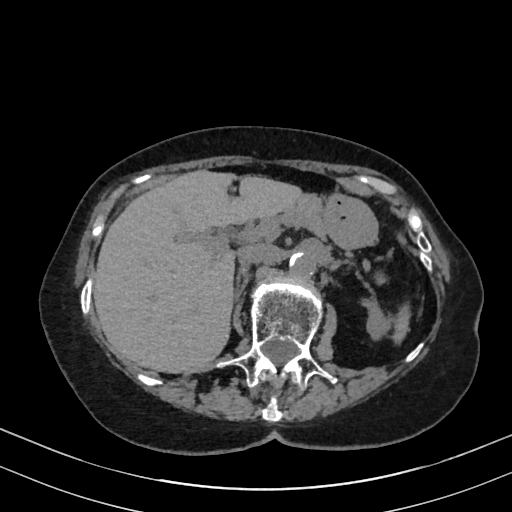
[im 14/172  lung]
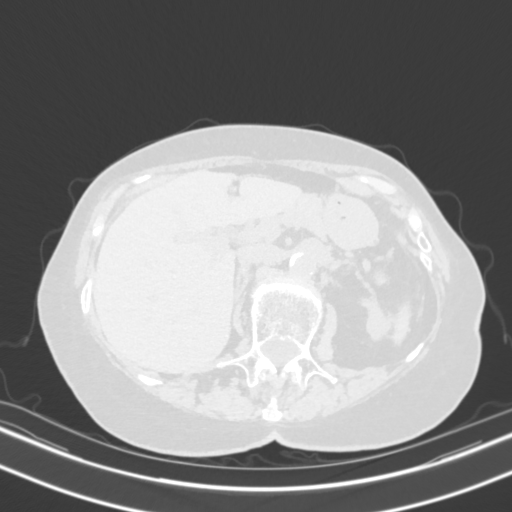
[im 27/172  lung]
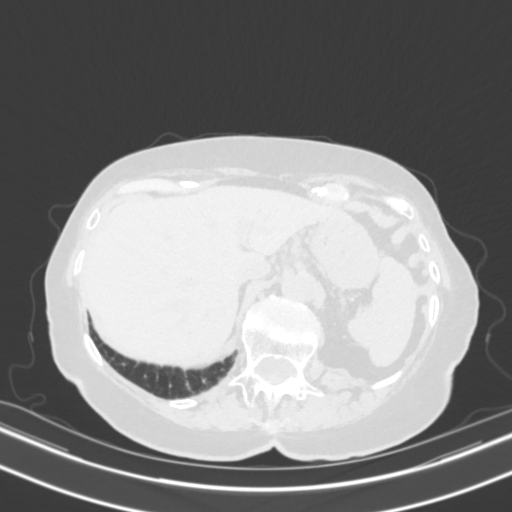
[im 40/172  lung]
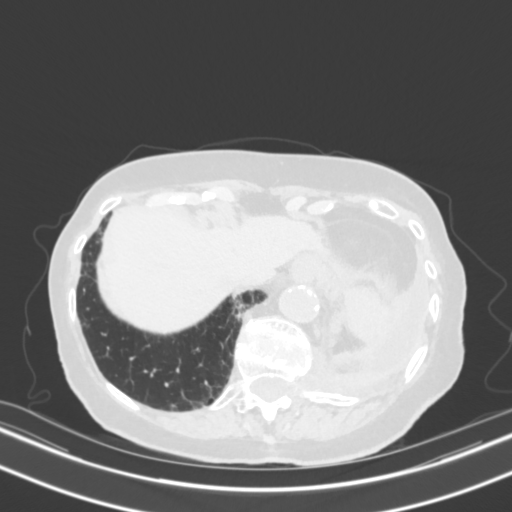
[im 66/172  lung]
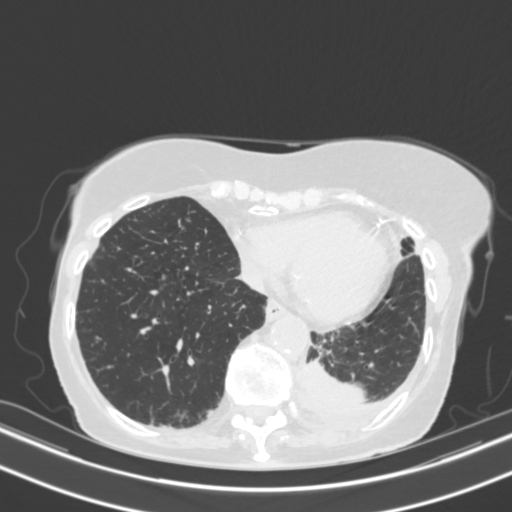
[im 79/172  mediastinal]
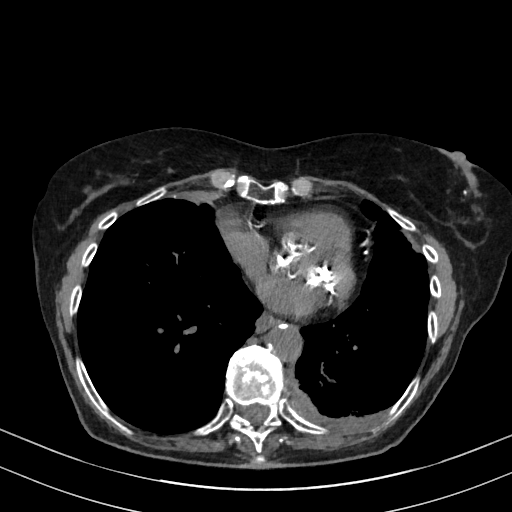
[im 79/172  lung]
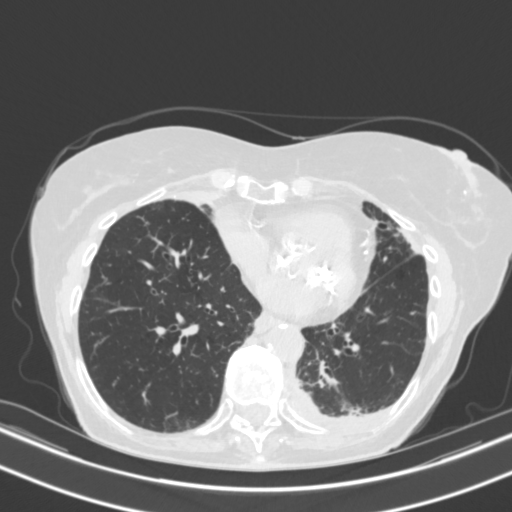
[im 83/172  lung]
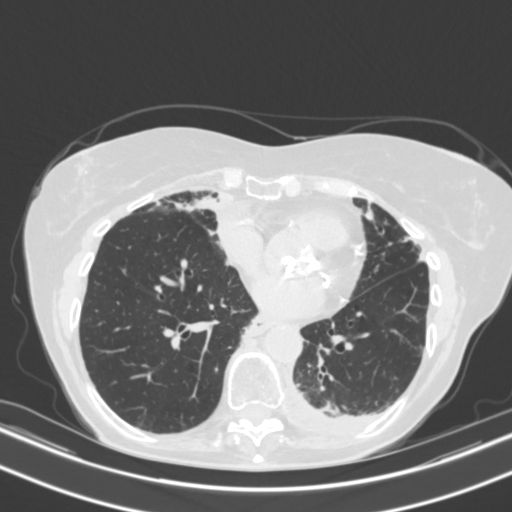
[im 93/172  lung]
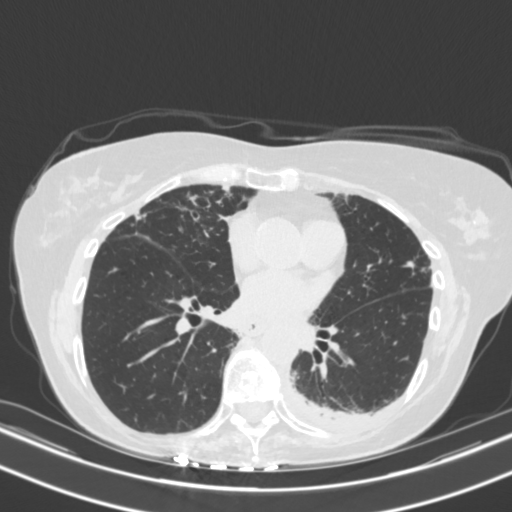
[im 106/172  lung]
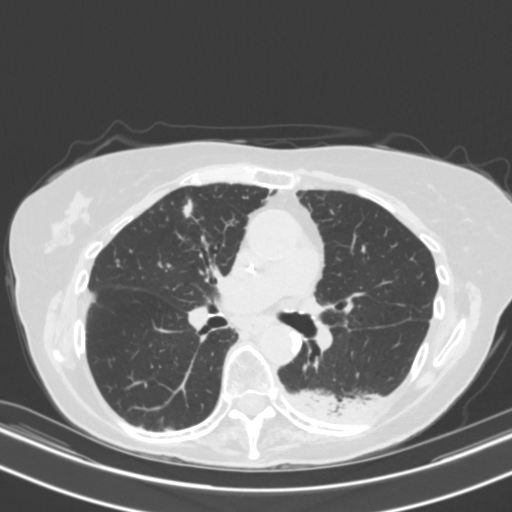
[im 132/172  mediastinal]
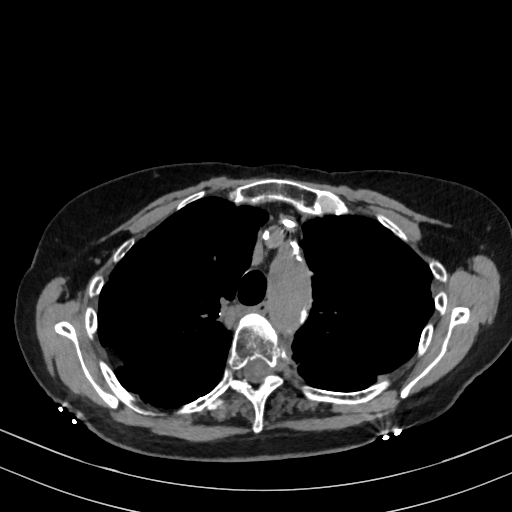
[im 132/172  lung]
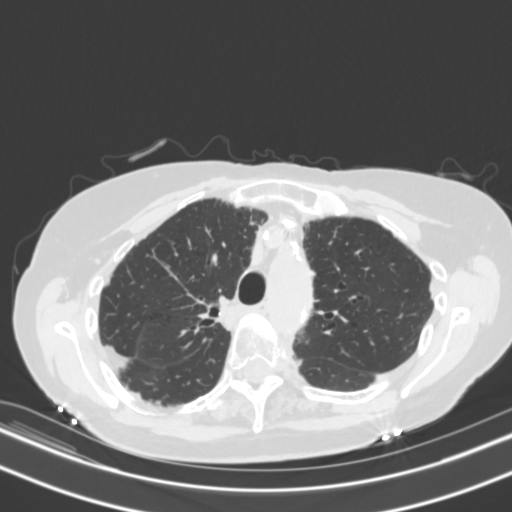
[im 145/172  lung]
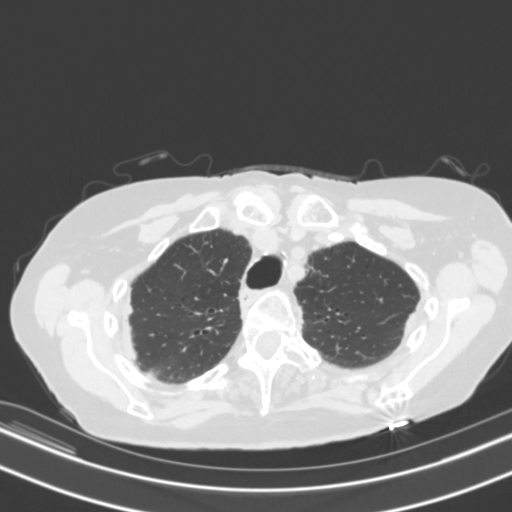
[im 158/172  lung]
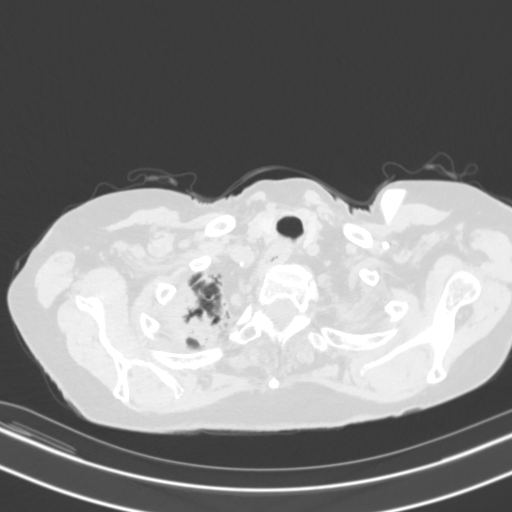

[Series 4: coronal · coronal · 0.68mm/px · 3 of 135 slices shown]
[im 27/135  lung]
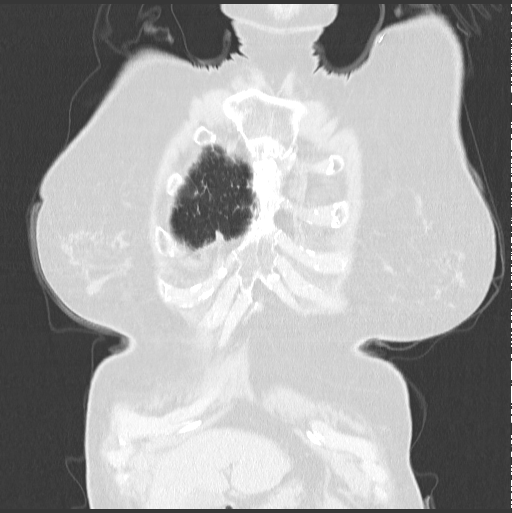
[im 54/135  lung]
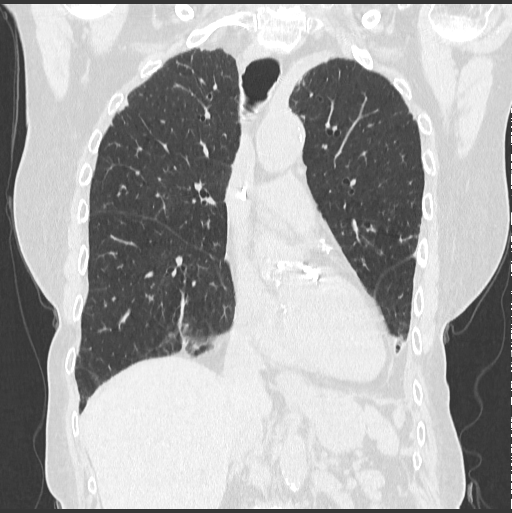
[im 81/135  lung]
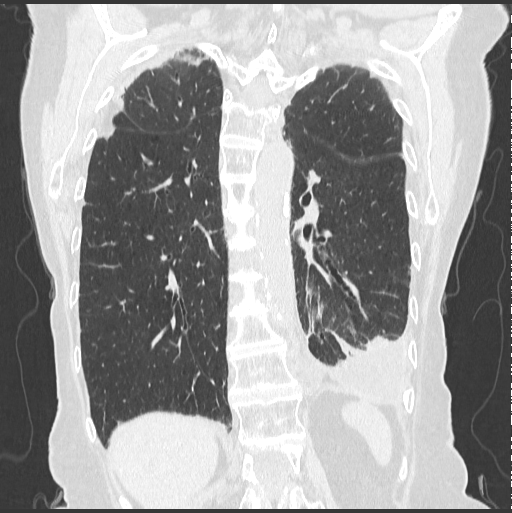

[14 of 36 positions shown; findings below may reference images not displayed]

FINDINGS: Thoracic inlet is within normal limits. There is no enlarged 
supraclavicular or axillary adenopathy. 
No adenopathy appreciated in the upper abdomen. There is left-sided 
hydronephrosis partially visualized. 
There is a loculated collection in the inferior left lung that appears similar 
to prior exam. 
The heart is normal in size. There is no pericardial effusion. There is a very 
large amount of calcified coronary atherosclerotic disease. Mitral annular and 
aortic valvular calcifications are present as well. 
The ascending aorta is nonaneurysmal. There is no mediastinal or hilar 
lymphadenopathy present. The main pulmonary artery is normal in caliber. 
Masslike opacity in the inferior medial left lower lobe is unchanged compared to 
exam from 08/23/2020 and appears to represent an area of rounded atelectasis. 
There is bronchiectasis in the lower lobes, right middle lobe, and lingular 
region. There is mild peripheral reticulation consistent with a component of 
age-related pulmonary fibrotic change. There is pleural parenchymal scarring 
apical regions. There is no focal consolidation to suggest active airspace 
disease. Her graft there are scattered pulmonary nodules. Persistent 
opacification of the posterior left lower lobe, similar to PET/CT images. The 
right middle lobe nodule described on 11/10/2020 is stable in size measuring 7 mm 
(image 68 of series 3). This is not significantly changed compared to exam from 
08/23/2020 at which time it measured upwards of 9 to 10 mm. Differences related 
to measurement technique. 
There is mild emphysematous changes. 
Lingular nodule measuring 5 mm (image 80 of series 3) is unchanged to prior. No 
new or enlarging nodularity. 
There are degenerative changes throughout the osseous structures. There is no 
aggressive appearing osseous lesion.
IMPRESSION: 1.  Stable findings compared to priors. Predominantly right middle lobe and 
lingular bronchiectasis and nodularity suggestive of Mycobacterium avium 
intracellular. No new or enlarging pulmonary nodules. 
2.  Stable left loculated effusion, adjacent rounded atelectasis, and 
consolidation in the posterior most aspect of the left lower lobe. 
3.  Extensive calcified coronary atherosclerotic disease. 
4.  No adenopathy. Appreciated throughout the chest. 
5.  Left-sided hydronephrosis appearing similar to CT chest abdomen pelvis from 
08/23/2020. 
RADIATION DOSE REDUCTION: All CT scans are performed using radiation dose 
reduction techniques, when applicable.  Technical factors are evaluated and 
adjusted to ensure appropriate moderation of exposure.  Automated dose 
management technology is applied to adjust the radiation doses to minimize 
exposure while achieving diagnostic quality images.

## 2021-01-27 IMAGING — MR MRI LUMBAR SPINE WITHOUT CONTRAST
4 of 5 series · 23 of 48 positions shown · non-contrast
Comparison: TECHNIQUE: Sagittal T1, Sagittal T2, Sagittal STIR, Axial T1 and 
Axial T2 MR images of the lumbar spine were performed without intravenous 
gadolinium enhancement.

MRI LUMBAR SPINE WITHOUT CONTRAST, 01/27/2021 [DATE]: 
CLINICAL INDICATION: B-cell lymphoma, radiation treatment, chemotherapy, low 
back pain

[Series 101: survey · axial · 10.0mm · 1.39mm/px · z∈[+15,+199]mm · 3 of 14 slices shown]
[im 3/14]
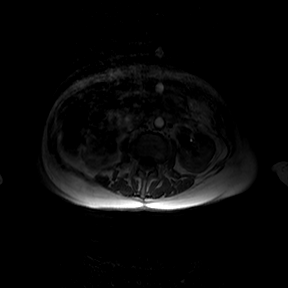
[im 8/14]
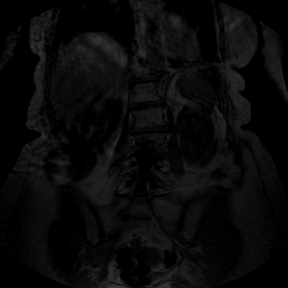
[im 14/14]
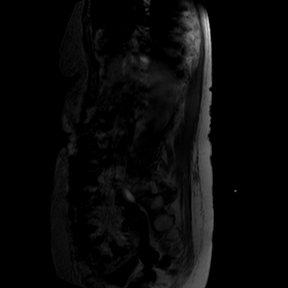

[Series 301: t1w tse sag · sagittal · 4.0mm · 0.50mm/px · 3 of 17 slices shown]
[im 3/17]
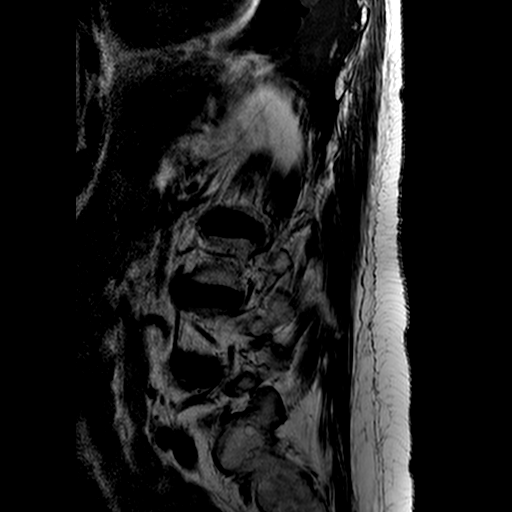
[im 9/17]
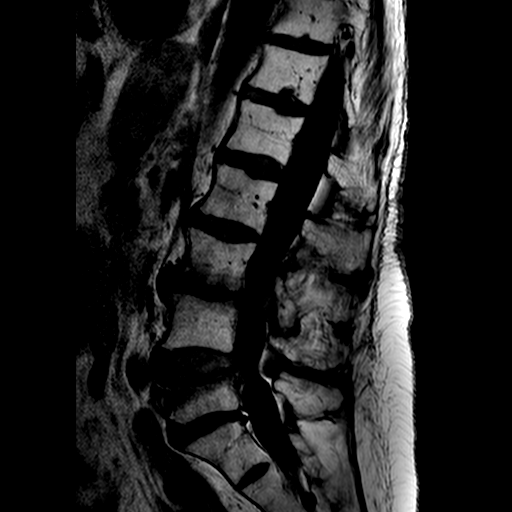
[im 14/17]
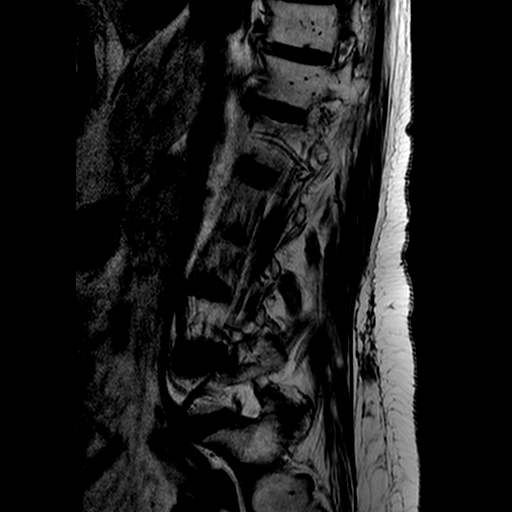

[Series 601: T2 · axial · 4.0mm · 0.47mm/px · z∈[-99,+107]mm · 9 of 30 slices shown]
[im 1/30]
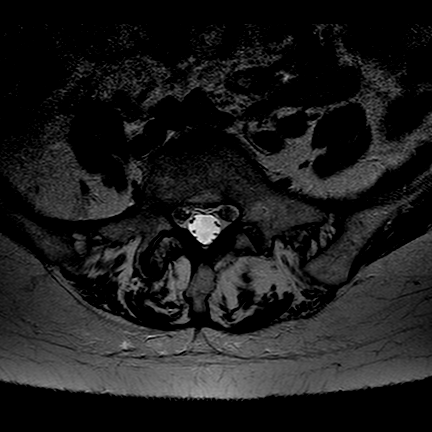
[im 5/30]
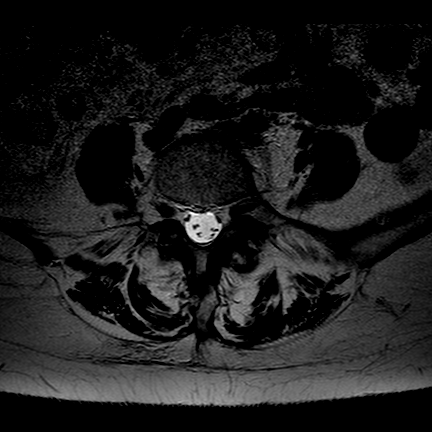
[im 10/30]
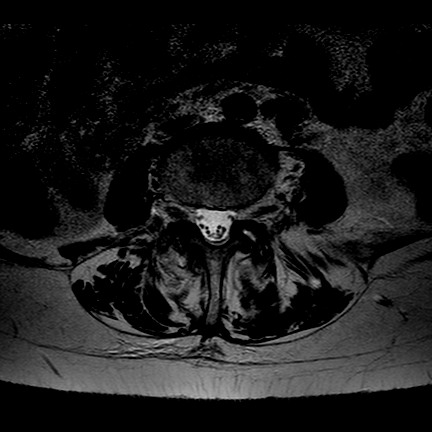
[im 13/30]
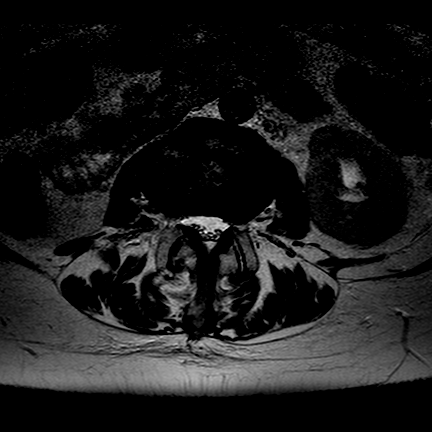
[im 15/30]
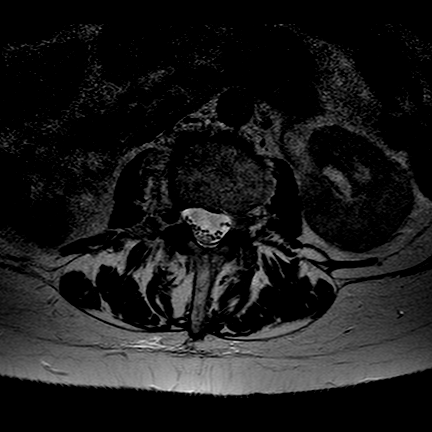
[im 17/30]
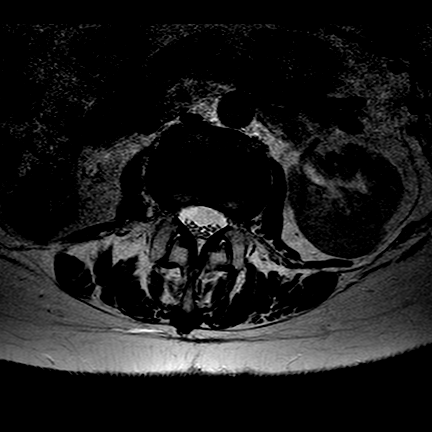
[im 20/30]
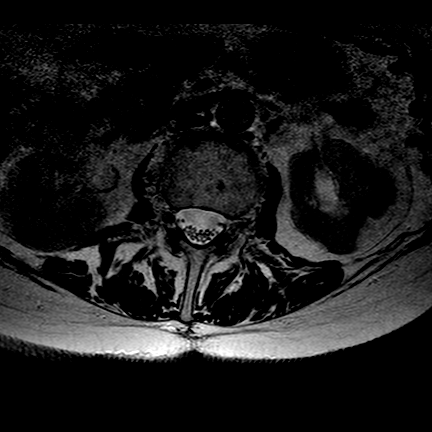
[im 25/30]
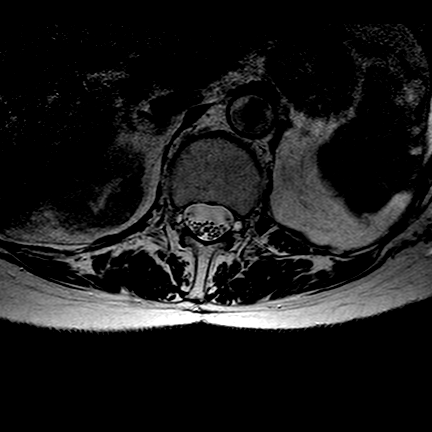
[im 30/30]
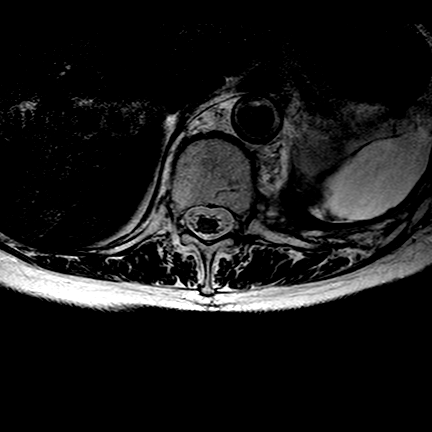

[Series 701: T1 · axial · 4.0mm · 0.35mm/px · z∈[-101,+58]mm · 8 of 35 slices shown]
[im 1/35]
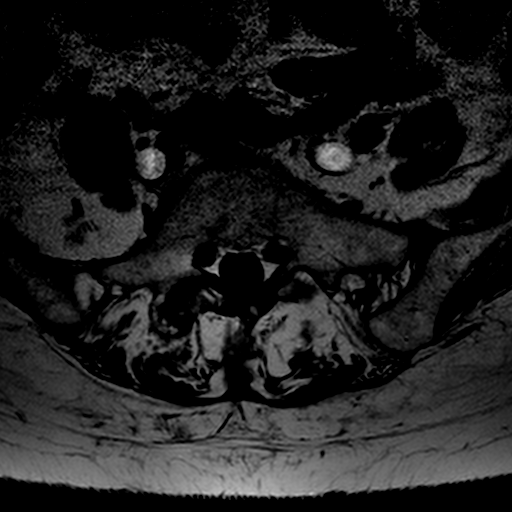
[im 5/35]
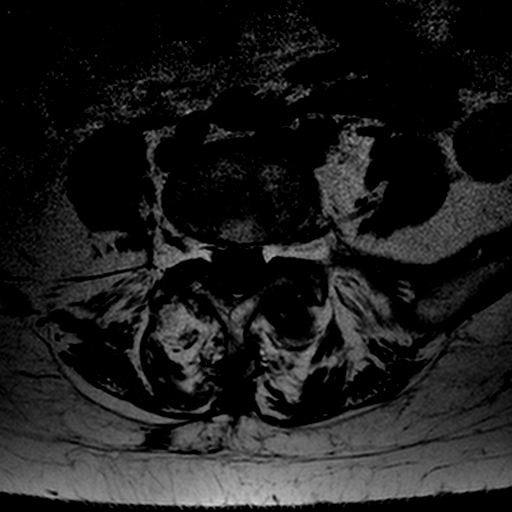
[im 10/35]
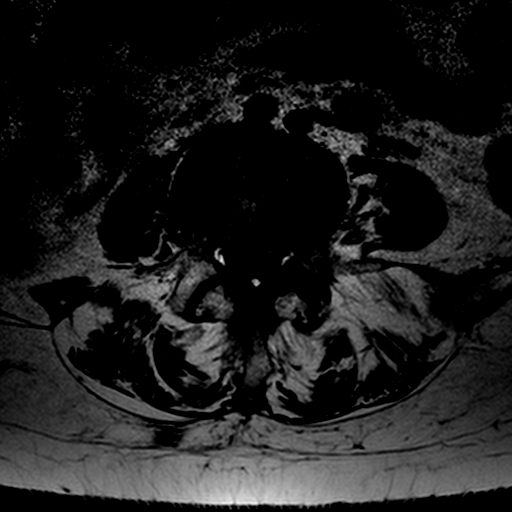
[im 15/35]
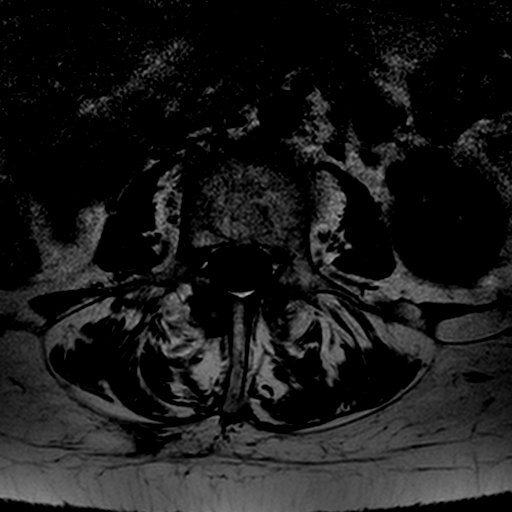
[im 18/35]
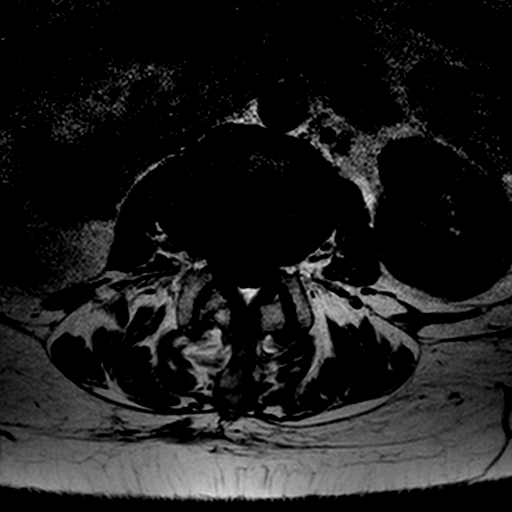
[im 20/35]
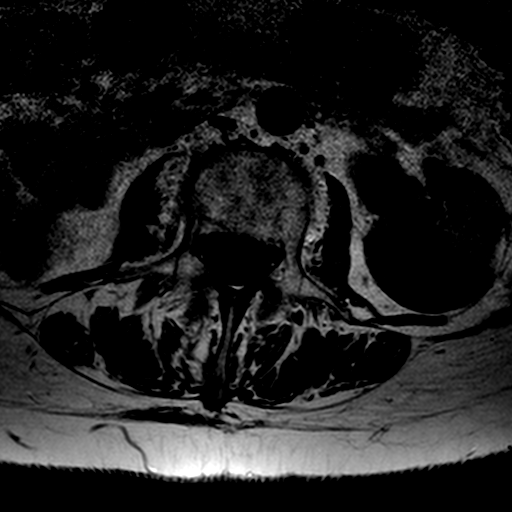
[im 25/35]
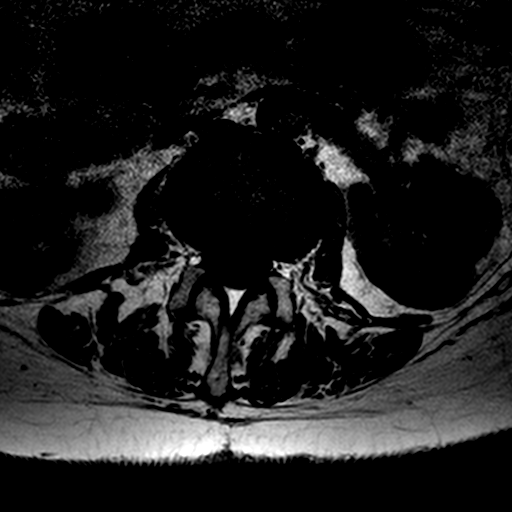
[im 30/35]
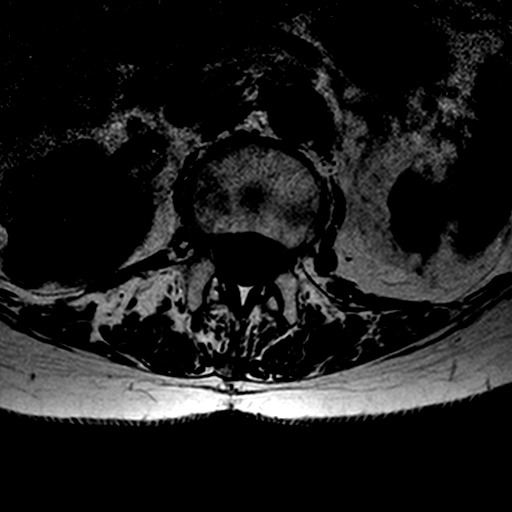

[23 of 48 positions shown; findings below may reference images not displayed]

FINDINGS: There is mild recent superior endplate compression fracture of L5. 
There is approximately 30% loss of height. Other lumbar vertebral heights are 
intact. No discrete findings to indicate lumbar malignancy. 
There is mild canal stenosis at L4-5. No other significant lumbar canal 
stenosis. At L3-4 there is marked disc narrowing with degenerative reactive 
endplate edema to the right of midline and borderline canal stenosis. 
Aorta shows normal caliber. There is no retroperitoneal adenopathy. Upper 
visualized sacrum shows no fracture. There are sacral Tarlov cysts, of unlikely 
significance. Localizer shows upper lumbar levoscoliosis.
IMPRESSION: Recent mild L5 fracture. This appears amenable to vertebral augmentation. 
No evidence for lumbar spinal malignancy. 
Degenerative changes. There is mild canal stenosis at L4-5 due to facet and 
ligamentous hypertrophy. There is 2 mm degenerative anterolisthesis at this 
level. 
Marked disc narrowing at L3-4 with moderate Modic type I change. No significant 
stenosis at this level. 
Upper lumbar levoscoliosis.

## 2021-04-17 IMAGING — MG MAMMOGRAPHY SCREENING BILATERAL 3[PERSON_NAME]
8 series · 9 of 24 positions shown · non-contrast
Comparison: Comparison was made to prior examinations.

________________________________________________________________________________________________ 
MAMMOGRAPHY SCREENING BILATERAL 3VRINDA KOTHARI, 04/17/2021 [DATE]: 
CLINICAL INDICATION: Screening.
TECHNIQUE: Digital bilateral mammograms and 3-D Tomosynthesis were obtained. 
These were interpreted both primarily and with the aid of computer-aided 
detection system.  
BREAST DENSITY: (Level C) The breasts are heterogeneously dense, which may 
obscure small masses.

[R CC]
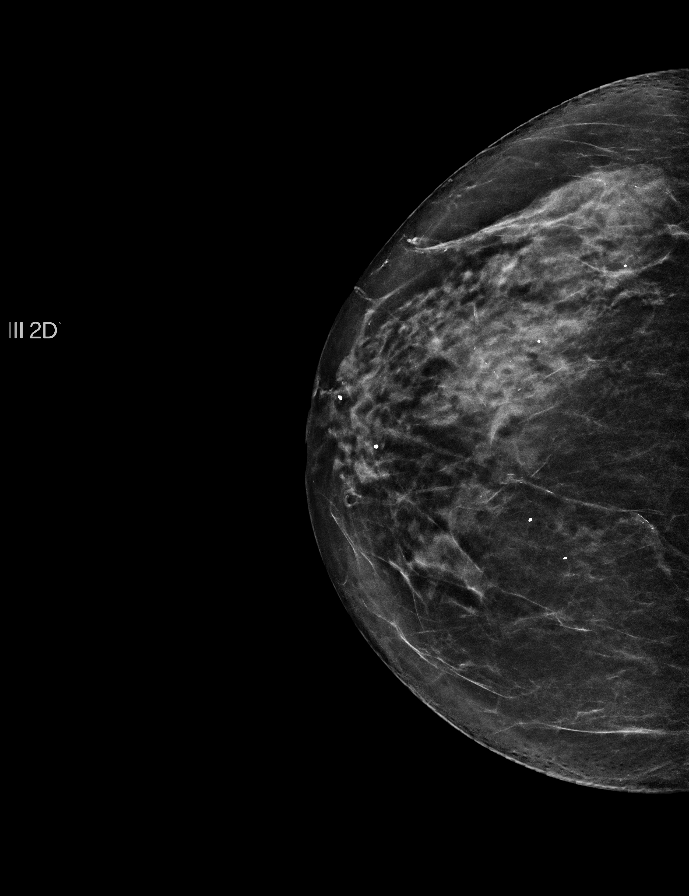

[L MLO]
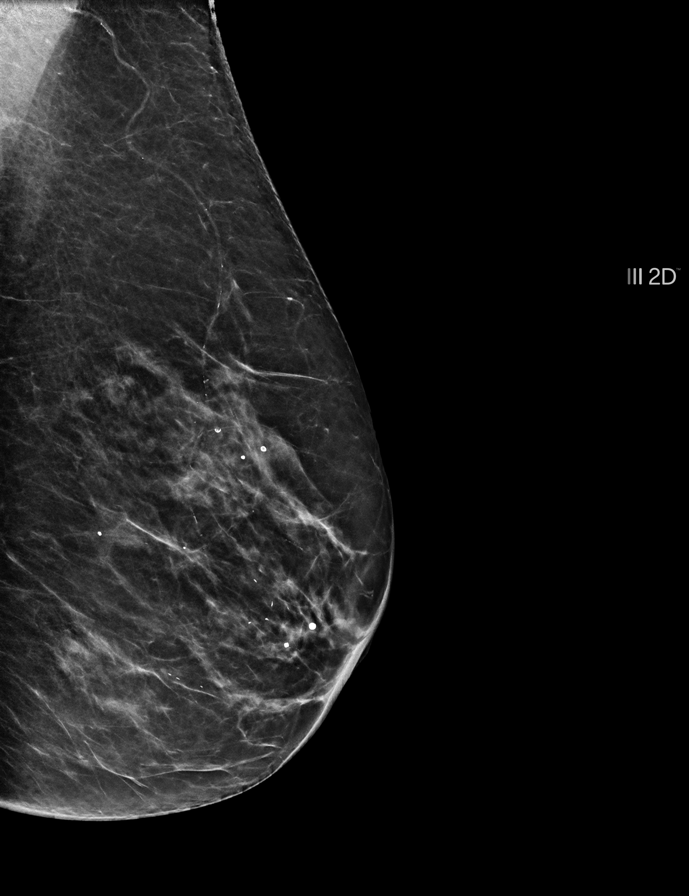

[R MLO]
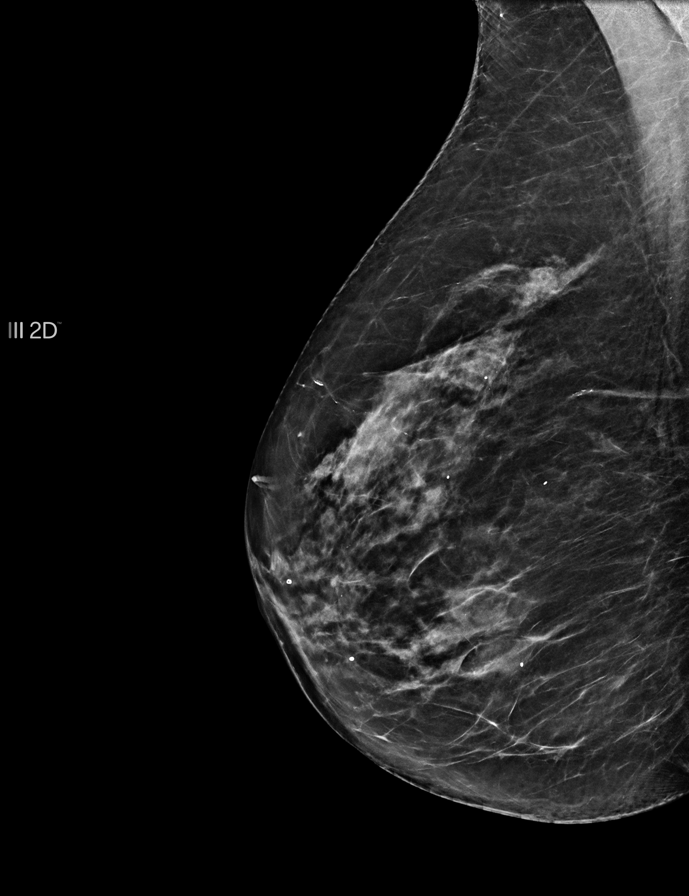

[L CC]
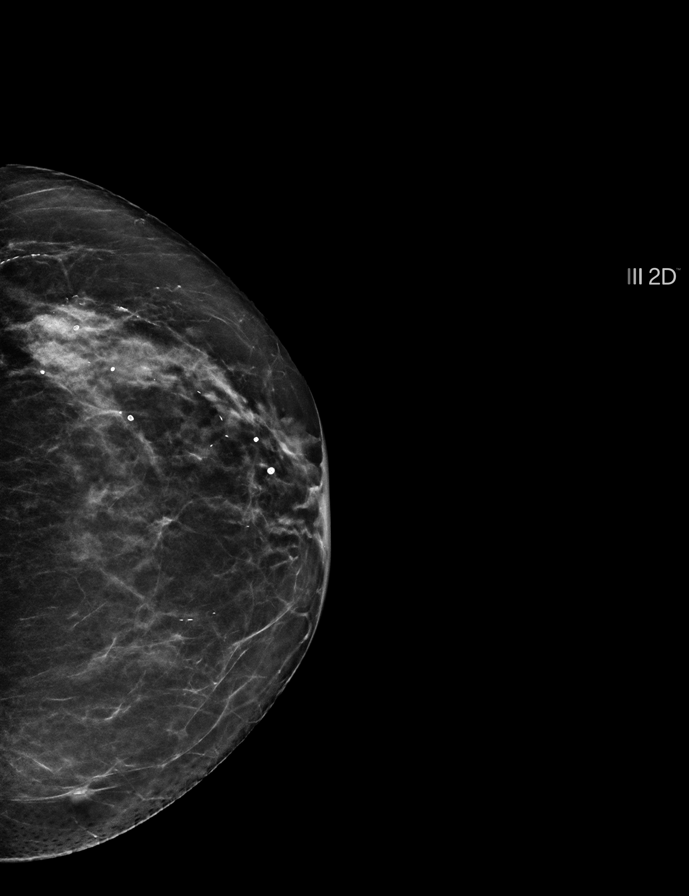

[L CC tomo · 2 of 51 frames shown]
[frame 17/51]
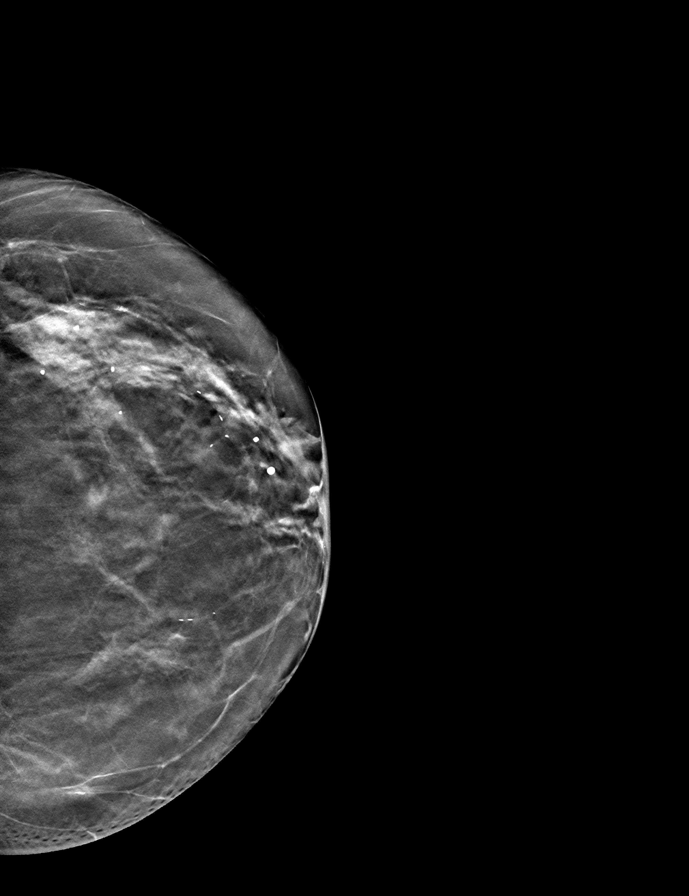
[frame 26/51]
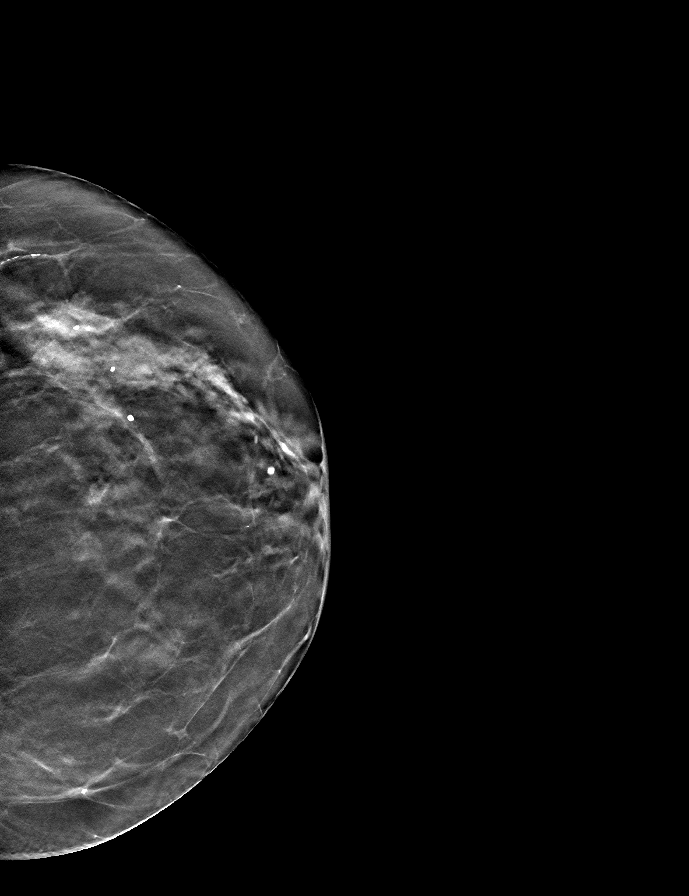

[R CC tomo · tomo slice 25/49.0]
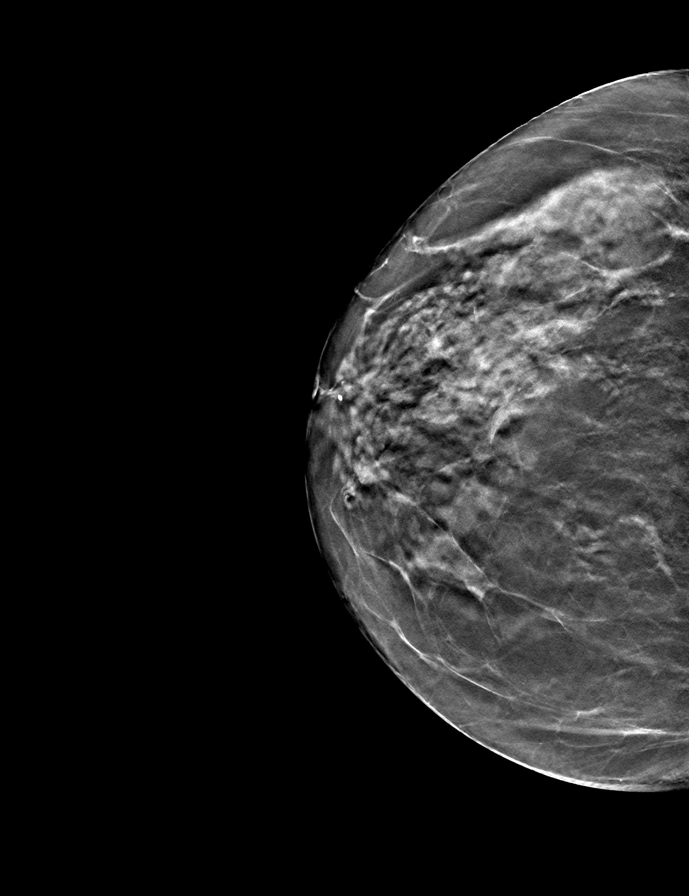

[R MLO tomo · tomo slice 28/55.0]
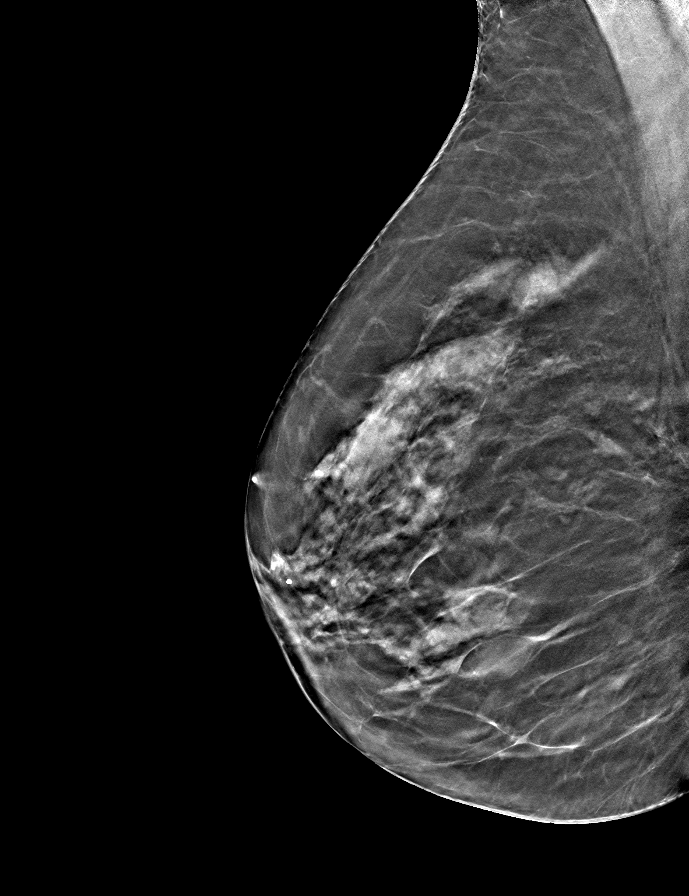

[L MLO tomo · tomo slice 28/55.0]
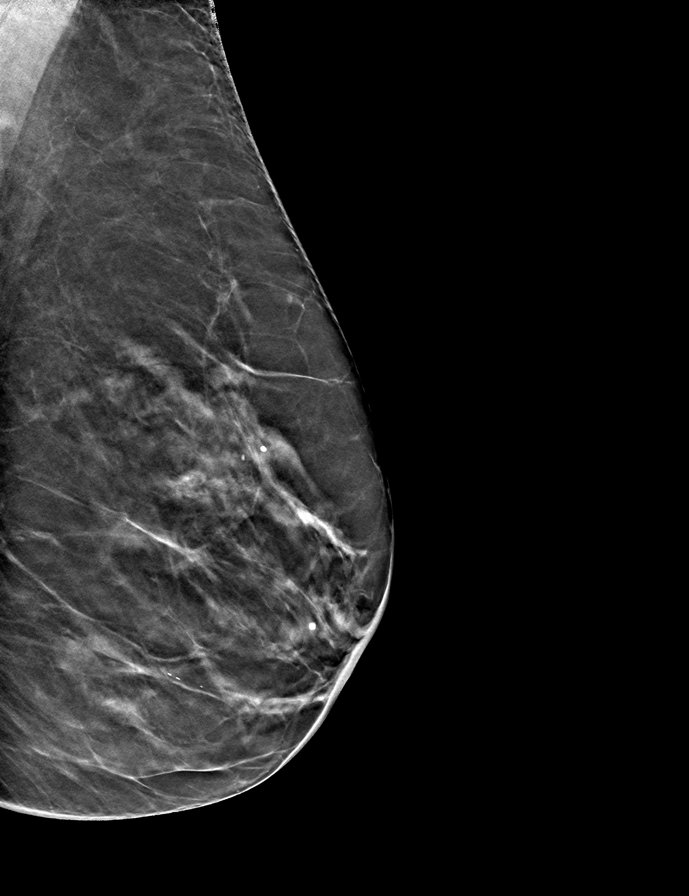

[9 of 24 positions shown; findings below may reference images not displayed]

FINDINGS: Stable exam. No suspicious mass, calcifications, or area of 
architectural distortion in either breast.
IMPRESSION: No mammographic evidence of malignancy in either breast. 
(BI-RADS 2) Benign findings. Routine mammographic follow-up is recommended.

## 2021-11-16 IMAGING — CT CT CHEST/ABDOMEN AND PELVIS WITH CONTRAST
1 of 3 series · 12 of 32 positions shown, 17 images · IV contrast (agent unspecified)
Comparison: CT of the chest from January 05, 2021.

________________________________________________________________________________________________ 
CT CHEST/ABDOMEN AND PELVIS WITH CONTRAST, 11/16/2021 [DATE]: 
(Films were taken at [facility].)  
CLINICAL INDICATION:  Lymphoma. Diffuse large B cell. 
A search for DICOM formatted images was conducted for prior CT imaging studies 
completed at a non-affiliated media free facility.
TECHNIQUE: The region of interest was scanned with 100 mL IV contrast on a high 
resolution CT scanner.  Routine MPR reconstructions were performed.

[Series 2: cap w · axial · 0.83mm/px · z∈[+664,+1231]mm · 12 of 217 slices shown, 17 images]
[im 14/217  soft-tissue]
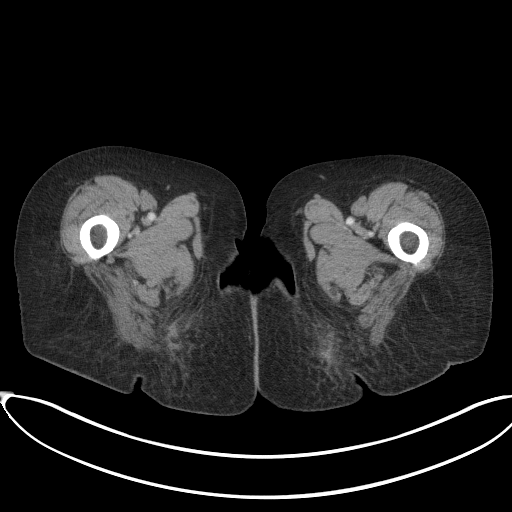
[im 14/217  bone]
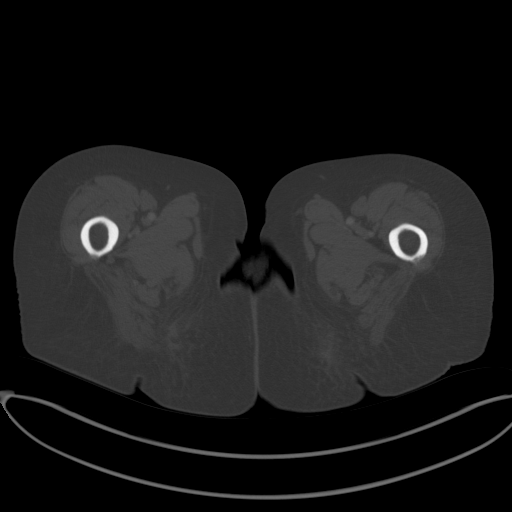
[im 41/217  soft-tissue]
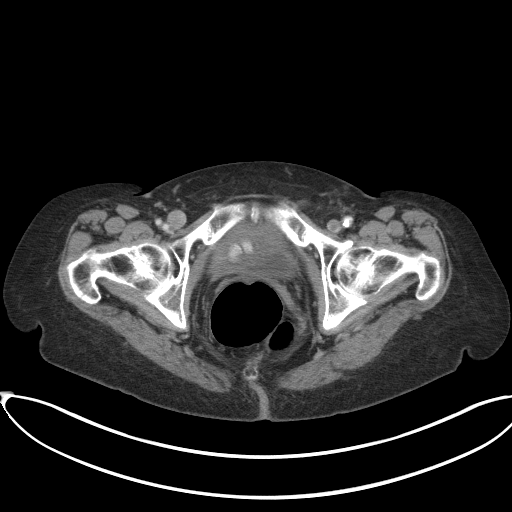
[im 55/217  soft-tissue]
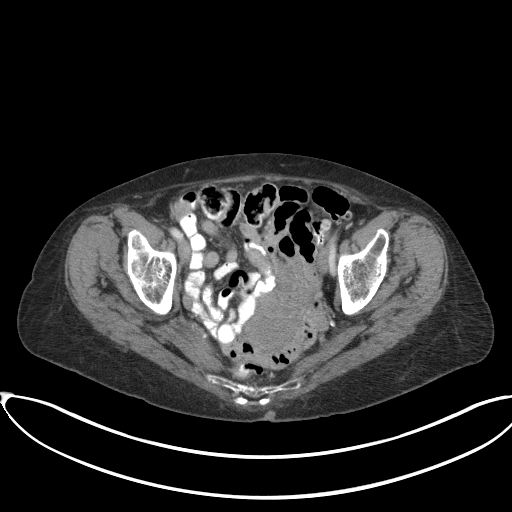
[im 68/217  soft-tissue]
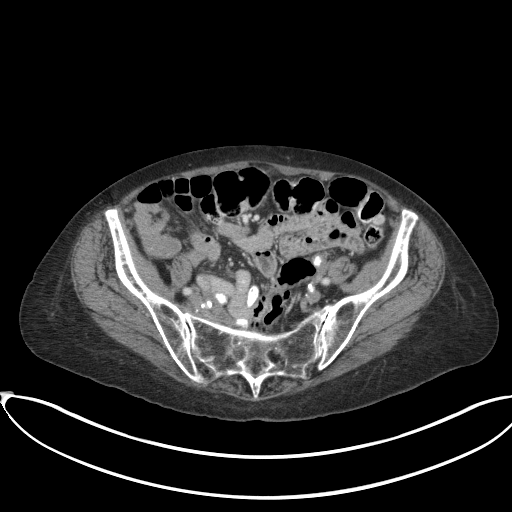
[im 95/217  soft-tissue]
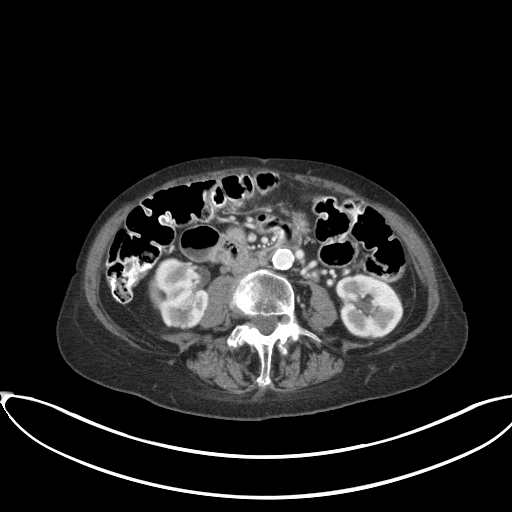
[im 109/217  soft-tissue]
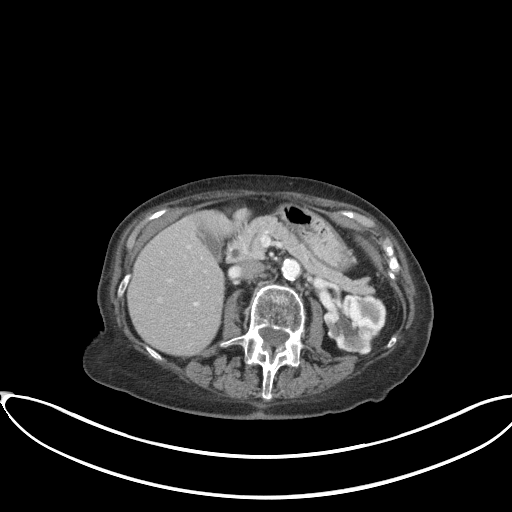
[im 122/217  soft-tissue]
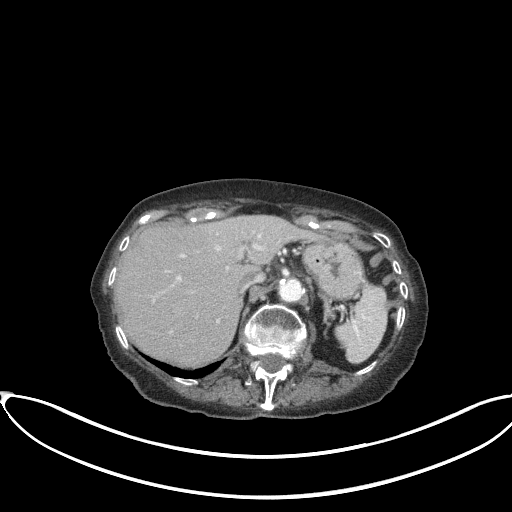
[im 149/217  soft-tissue]
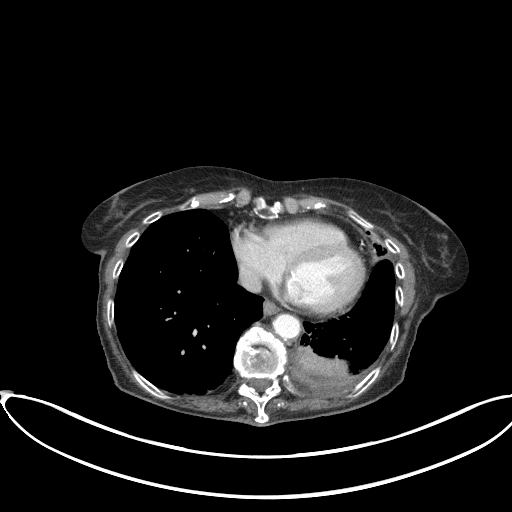
[im 163/217  soft-tissue]
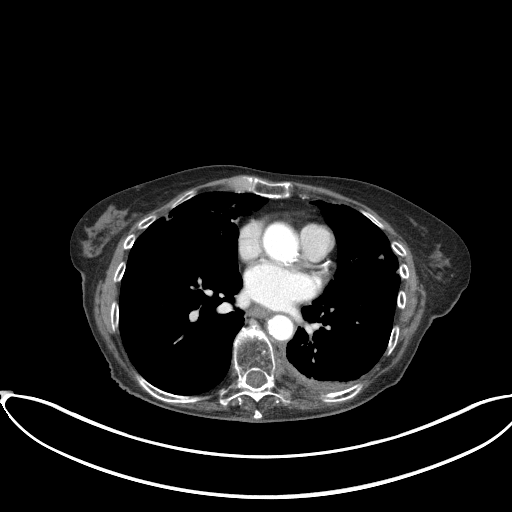
[im 163/217  lung]
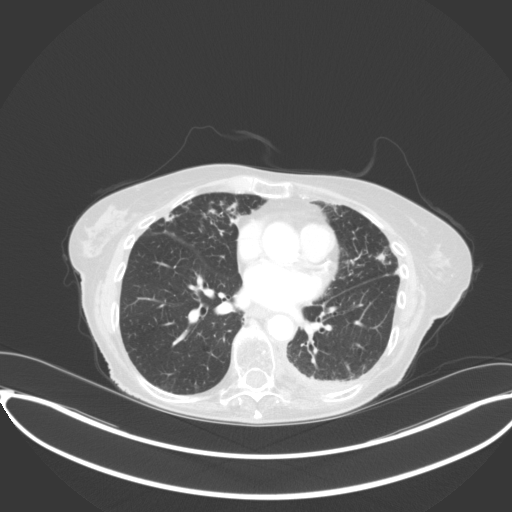
[im 163/217  bone]
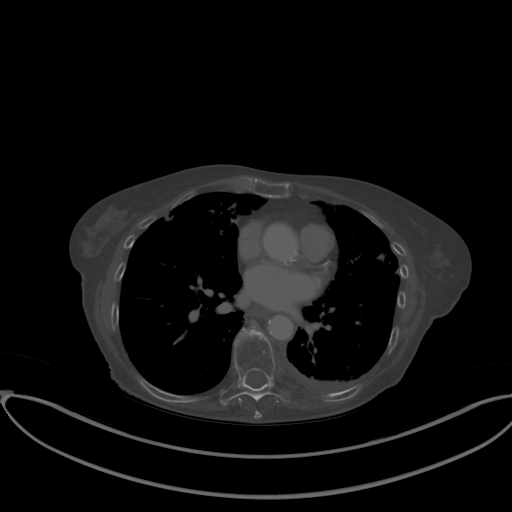
[im 176/217  soft-tissue]
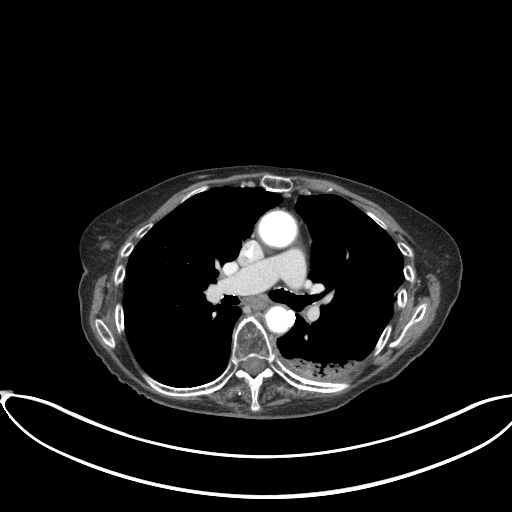
[im 176/217  lung]
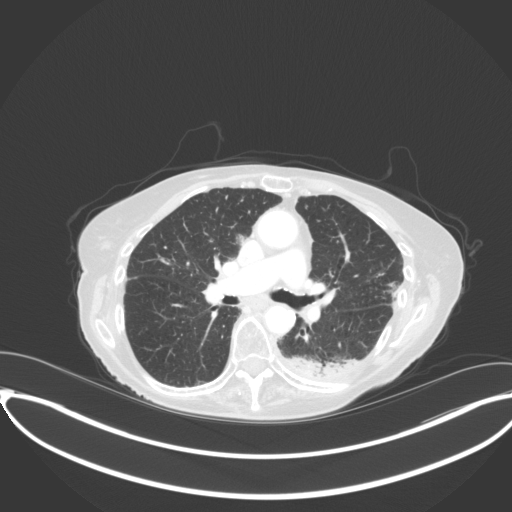
[im 190/217  lung]
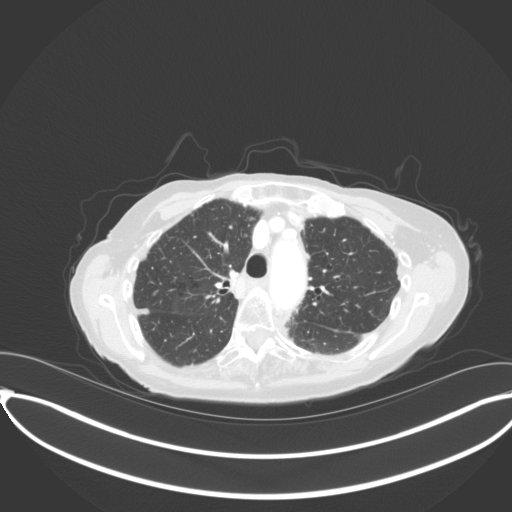
[im 203/217  soft-tissue]
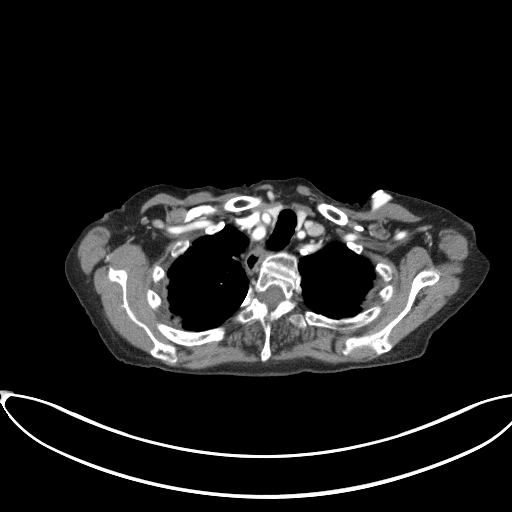
[im 203/217  lung]
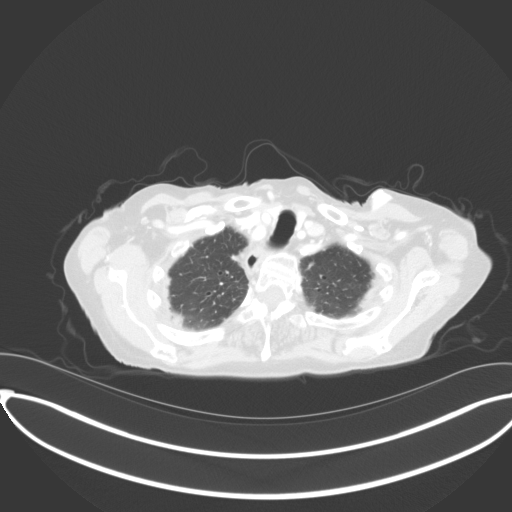

[12 of 32 positions shown; findings below may reference images not displayed]

CT of the chest abdomen and 
pelvis from August 23, 2020. MRI lumbar spine from July 11, 2021.
FINDINGS: --------------------------------------------------------------------------- 
LUNGS AND PLEURA: 
Bronchiectasis with tree-in-bud opacity in the right middle lobe and lingula. 
This has progressed and is likely secondary to infectious/inflammatory 
bronchiolitis. This is noted to lesser extent along the right lung base 
anteriorly. 
Continued airspace opacity of the left lung base posteriorly. Small loculated 
left pleural effusion posteriorly-inferiorly. There is rind of enhancement 
around the loculated effusion. 
Along the posterior inferior left hemithorax is continued low attenuation 
material which extends medially to the left paravertebral space at T12 level. 
This represents stable treated lymphoma. 
--------------------------------------------------------------------------- 
MEDIASTINUM:  No pathologic lymphadenopathy.   
CARDIOVASCULAR: Heart size is normal.  Coronary artery calcifications. Status 
post TAVR.  Vascular calcifications. 
LOWER NECK: No focal mass. 
CHEST WALL/AXILLA: No mass or adenopathy. Left anterior upper chest wall port 
catheter, tip in the SVC. 
OSSEOUS STRUCTURES: No acute osseous abnormality. Scattered degenerative 
changes. Chronic healed left 12th rib fracture. 
--------------------------------------------------------------------------- 
HEPATOBILIARY: No evidence for mass or biliary dilatation. Too small to 
characterize low-attenuation lesion (likely cyst)  in the right lobe of liver 
superiorly. No gallstones. 
PANCREAS: No ductal dilatation or mass.   
SPLEEN: Normal in size. 
ADRENALS: No mass. 
GENITOURINARY: No evidence for enhancing mass, stones or hydronephrosis.  Stable 
areas of volume loss in both kidneys from sequelae of prior ischemia versus 
infection. No bladder mass. 
STOMACH, SMALL BOWEL AND COLON: No bowel wall thickening or obstruction. 
Diverticulosis. 
ABDOMINAL/PELVIC LYMPH NODES: No adenopathy. 
VASCULAR STRUCTURES: No aneurysm. Vascular calcifications. 
PERITONEUM: No free fluid or free air. 
ABDOMINAL/PELVIC WALL: No mass or adenopathy.  
OSSEOUS STRUCTURES: Anterior to the right sacrum is stable ill-defined soft 
tissue is extends into the right S1 foramen. This is consistent with stable 
treated neoplasm. L1 compression fracture, stable, status post kyphoplasty. 
Retropulsion results in moderate central canal narrowing at the L1 level. L5 
compression fracture, stable, with mild retropulsion of superior endplate, 
status post kyphoplasty. L4 superior endplate compression fracture more so 
towards left side, new. 
ADDITIONAL FINDINGS: None. 
---------------------------------------------------------------------------
IMPRESSION: 1.  Pulmonary findings for progression of infectious or inflammatory 
bronchiolitis. 
2.  Stable small loculated left pleural effusion posteriorly and inferiorly. 
3.  Stable treated neoplasm along the left lower hemithorax extending to the 
left paravertebral soft tissues at the T12 level. Stable treated neoplasm along 
the right presacral soft tissues extending to right S1 neural foramen. 
4.  Stable L1 and L5 compression fractures status post kyphoplasty. New L4 
superior endplate compression fracture, age indeterminate. 
RADIATION DOSE REDUCTION: All CT scans are performed using radiation dose 
reduction techniques, when applicable.  Technical factors are evaluated and 
adjusted to ensure appropriate moderation of exposure.  Automated dose 
management technology is applied to adjust the radiation doses to minimize 
exposure while achieving diagnostic quality images.

## 2021-12-09 IMAGING — MR MRI LUMBAR SPINE WITHOUT CONTRAST
4 of 6 series · 18 of 48 positions shown · IV contrast (gadolinium)
Comparison: CT exam of 11/16/2021. MR exam of 07/11/2021.

________________________________________________________________________________________________ 
MRI LUMBAR SPINE WITHOUT CONTRAST, 12/09/2021 [DATE]: 
CLINICAL INDICATION: Follow-up exam. Low back pain. No known trauma.
TECHNIQUE: Multiplanar, multiecho position MR images of the lumbar spine were 
performed without intravenous gadolinium enhancement. Patient was scanned on a 
1.5T magnet.

[Series 101: survey · axial · 10.0mm · 1.25mm/px · z∈[-33,+201]mm · 4 of 10 slices shown]
[im 1/10]
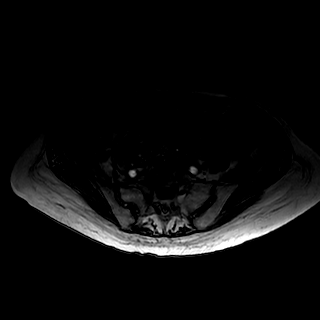
[im 4/10]
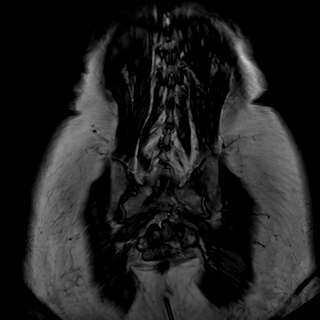
[im 7/10]
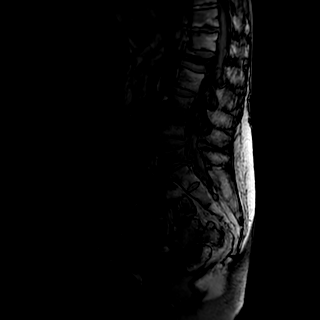
[im 10/10]
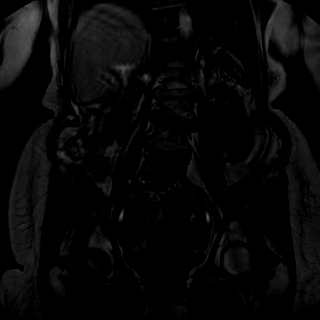

[Series 201: t2w_cor-surv · coronal · 6.0mm · 0.62mm/px · 5 of 10 slices shown]
[im 1/10]
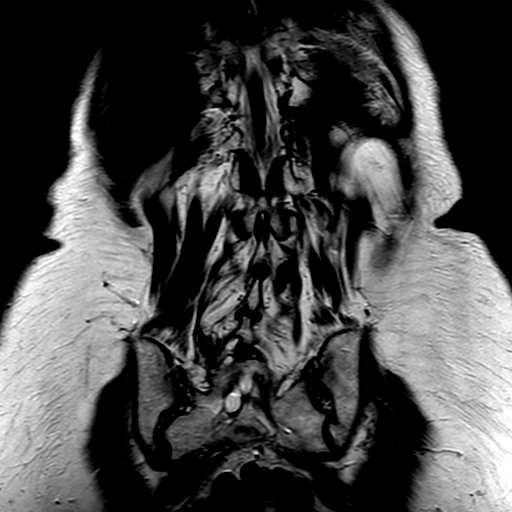
[im 3/10]
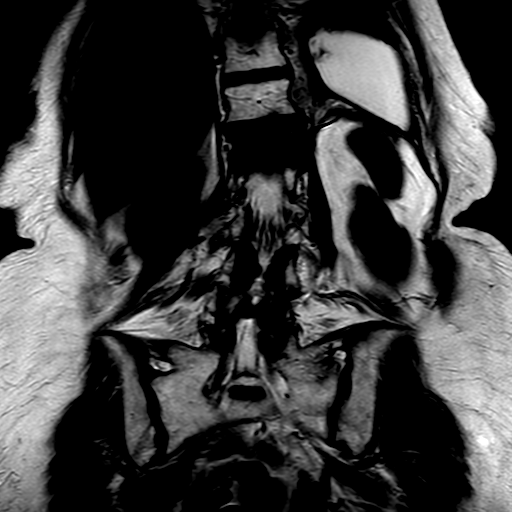
[im 5/10]
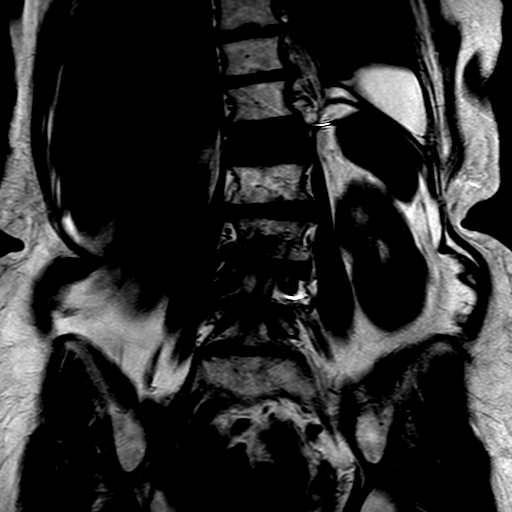
[im 7/10]
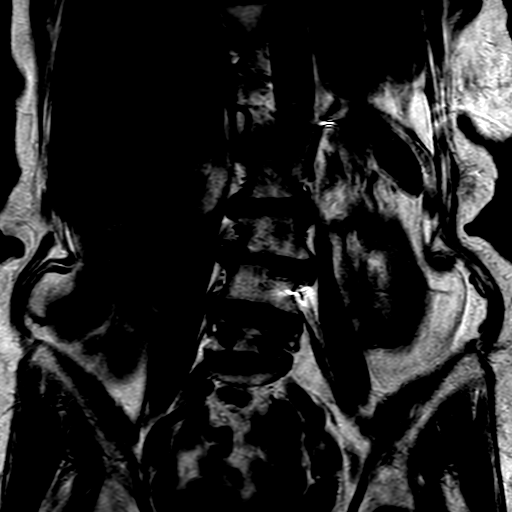
[im 10/10]
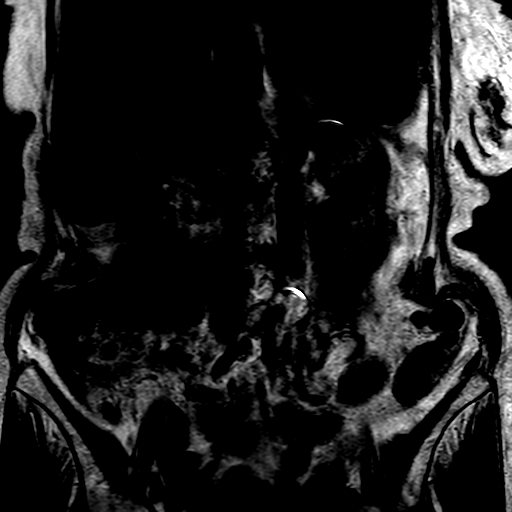

[Series 301: t1_tse_sag · sagittal · 4.0mm · 0.38mm/px · 6 of 17 slices shown]
[im 1/17]
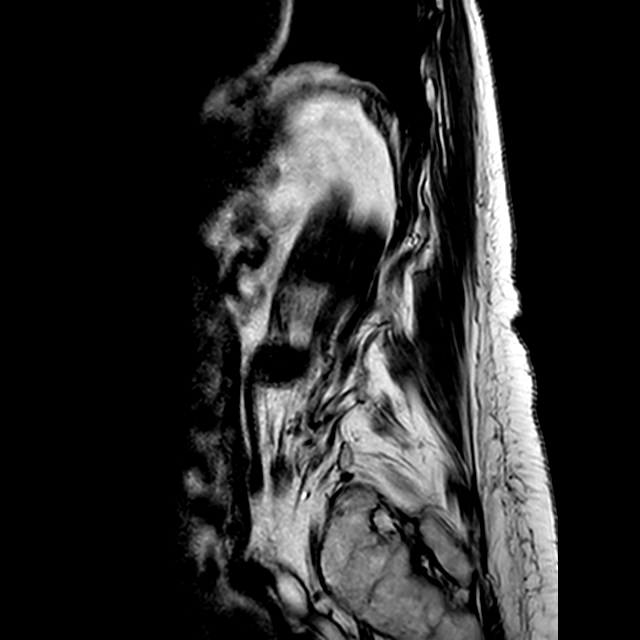
[im 3/17]
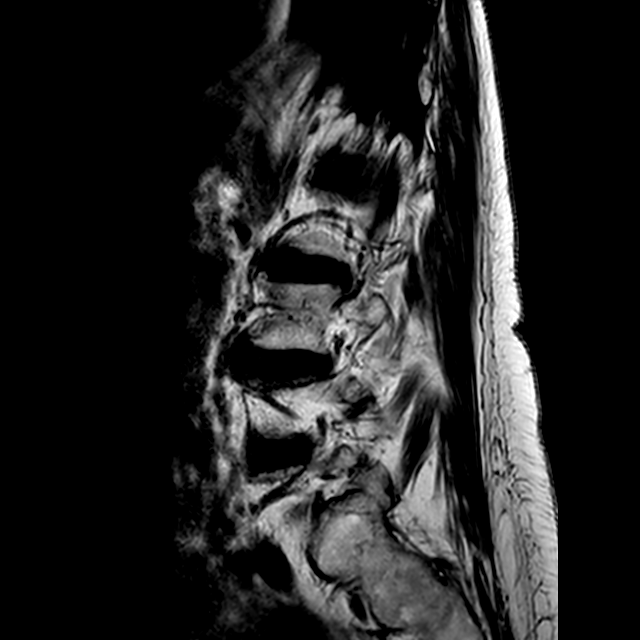
[im 5/17]
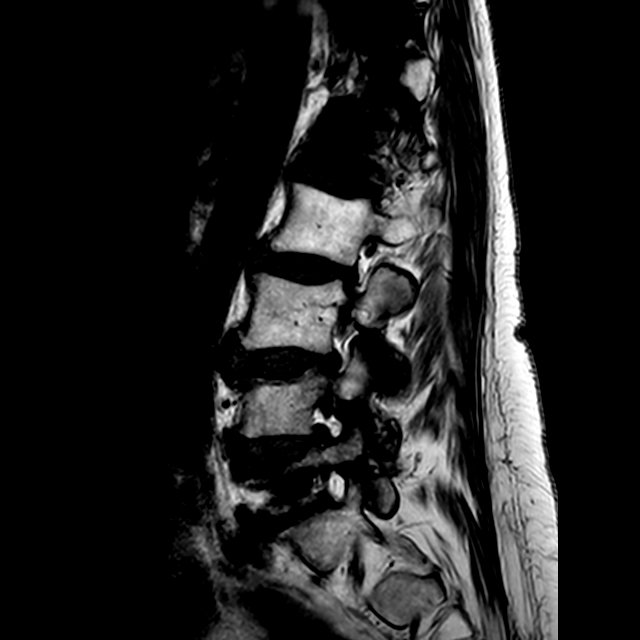
[im 7/17]
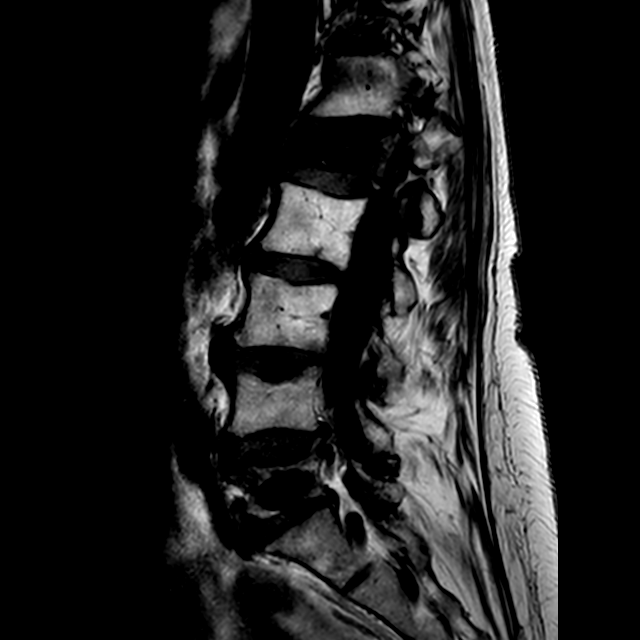
[im 10/17]
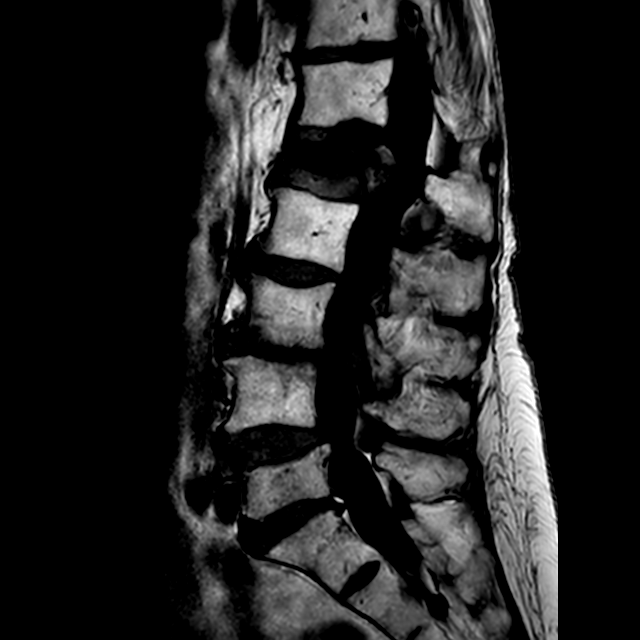
[im 14/17]
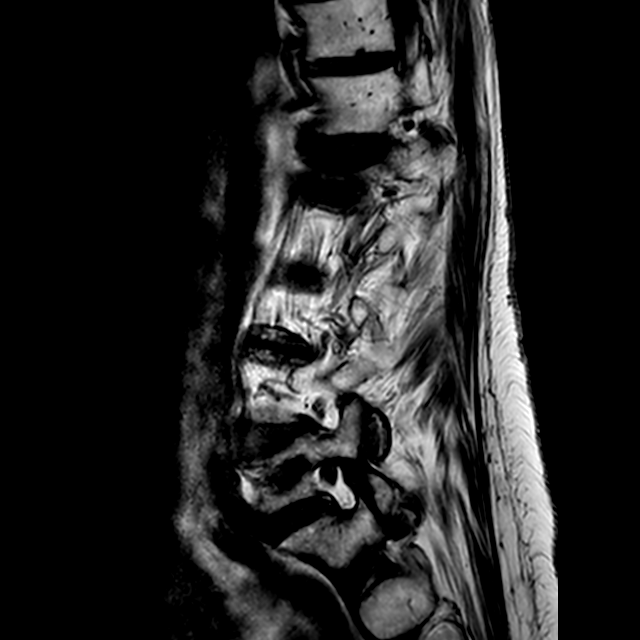

[Series 401: t2_tse_sag · sagittal · 4.0mm · 0.54mm/px · 3 of 17 slices shown]
[im 3/17]
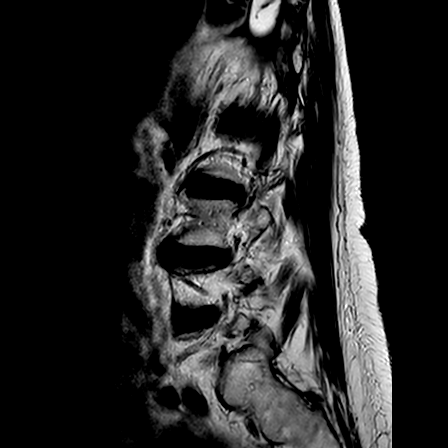
[im 10/17]
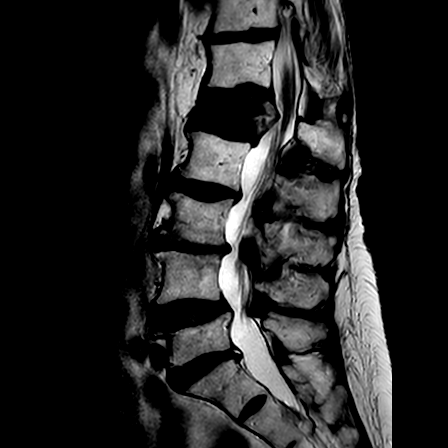
[im 14/17]
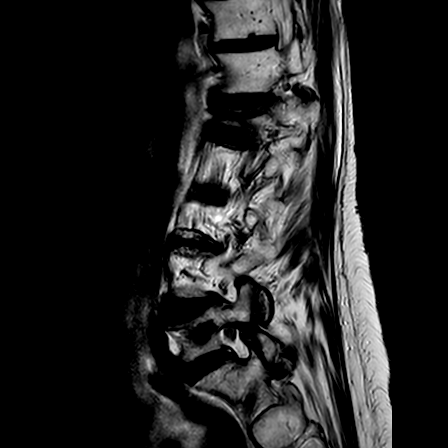

[18 of 48 positions shown; findings below may reference images not displayed]

FINDINGS: -------------------------------------------------------------------------------- 
------ 
GENERAL: 
There are 5 lumbar-type vertebral bodies.     
ALIGNMENT: Lumbar levocurvature. Mild anterolisthesis L4 on L5. 
VERTEBRAL BODY HEIGHT: There is chronic fracture deformity of L1 with a 
vertebral body plana appearance with methylmethacrylate in place. There is 
dorsal endplate retropulsion with effacement of the ventral thecal sac and 
abutment of the conus tip. There is chronic fracture deformity of L5 with 
approximately 50% loss of vertebral body height with methylmethacrylate in 
place. There is mild scalloping of the superior endplate of L4 without acute 
vertebral body fracture. 
CORD SIGNAL: Normal distal spinal cord and cauda equina. Conus medullaris 
terminates at L1. 
EXTRASPINAL STRUCTURES: Bilateral renal cortical scarring is partially included. 
Additionally, left renal cyst, one of which is hemorrhagic with decreased T2 
signal intensity is partially included. 
Modic I-II: T12-L1, L1-L2, L2-L3, L3-L4, L4-L5 and L5-S1. 
Ligamentum Flavum > 2.5 mm: All levels. 
-------------------------------------------------------------------------------- 
------ 
SEGMENTAL: 
T12-L1: There is ballooning of the disc space given the vertebral body fracture. 
No herniation. Normal facets. No spinal canal or neural foraminal stenosis. 
L1-L2: There is ballooning of the disc space given the vertebral body fracture. 
Mild broad-based disc protrusion. Normal facets. No spinal canal or neural 
foraminal stenosis. 
L2-L3: Moderate disc desiccation without disc height loss. Mild broad-based disc 
protrusion. Mild facet hypertrophy. No spinal canal or neural foraminal 
stenosis. 
L3-L4: Diffuse disc desiccation and disc height loss. Mild broad-based disc 
protrusion. Mild facet hypertrophy. Mild bilateral neural foraminal stenoses. 
L4-L5: Mild disc desiccation and dorsal disc height loss. Mild dorsal disc 
osteophyte complex. Moderate facet hypertrophy. Mild left neural foraminal 
stenosis. Mild spinal canal stenosis. 
L5-S1: Moderate disc desiccation mild dorsal disc height loss. Small annular 
tear and mild posterior paracentral disc protrusion. Mild facet hypertrophy. No 
spinal canal or neural foraminal stenoses. 
-------------------------------------------------------------------------------- 
------
IMPRESSION: 1.  Chronic fracture deformities of L1 and L5 with methylmethacrylate in place. 
2.  No acute vertebral body fracture. 
3.  Lumbar levocurvature and moderately advanced spondylotic changes. 
4.  Mild anterolisthesis L4 on L5. 
5.  Mild spinal canal stenosis at L4-L5. 
6.  Mild bilateral neural foraminal stenoses at L3-L4 and mild left-sided at 
L4-L5.

## 2022-05-01 IMAGING — CT CT CHEST/ABDOMEN AND PELVIS WITH CONTRAST
1 of 3 series · 11 of 32 positions shown, 16 images · IV contrast (isovue)
Comparison: CT chest abdomen pelvis November 16, 2021.

________________________________________________________________________________________________ 
CT CHEST/ABDOMEN AND PELVIS WITH CONTRAST, 05/01/2022 [DATE]: 
CLINICAL INDICATION:  Follow-up lymphoma. 
A search for DICOM formatted images was conducted for prior CT imaging studies 
completed at a non-affiliated media free facility.
TECHNIQUE: The region of interest was scanned with 80 ml of Isovue 370 injected 
intervenously on a high resolution CT scanner.  Routine MPR reconstructions were 
performed.

[Series 2: cap w · axial · 0.84mm/px · z∈[-518,+22]mm · 11 of 204 slices shown, 16 images]
[im 12/204  soft-tissue]
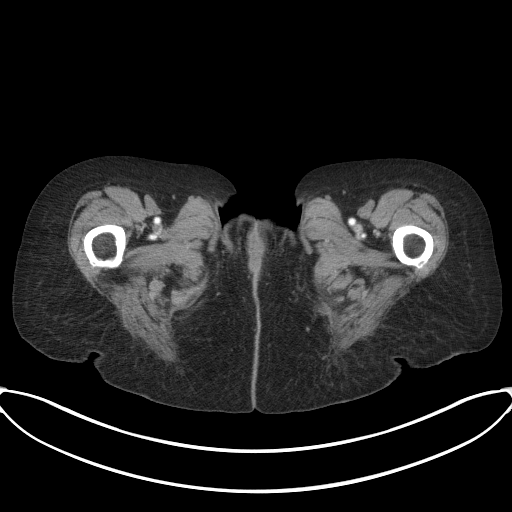
[im 12/204  bone]
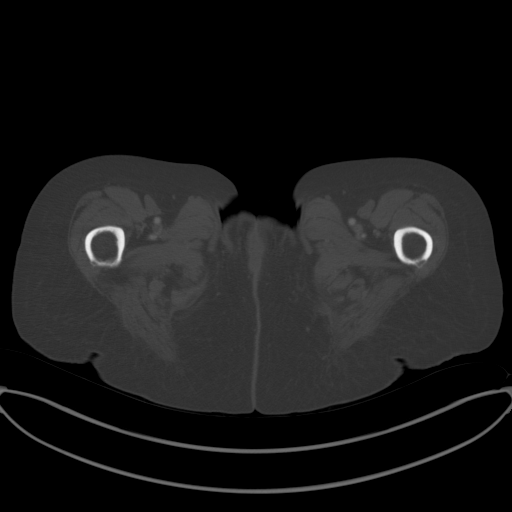
[im 36/204  soft-tissue]
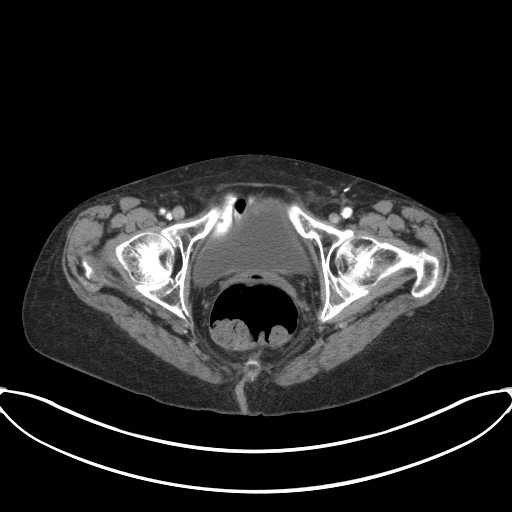
[im 60/204  soft-tissue]
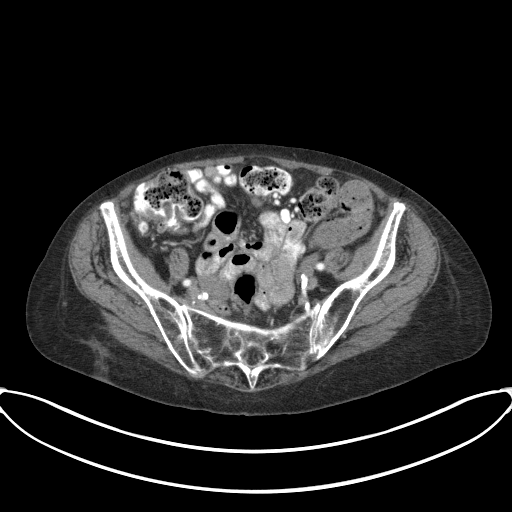
[im 72/204  soft-tissue]
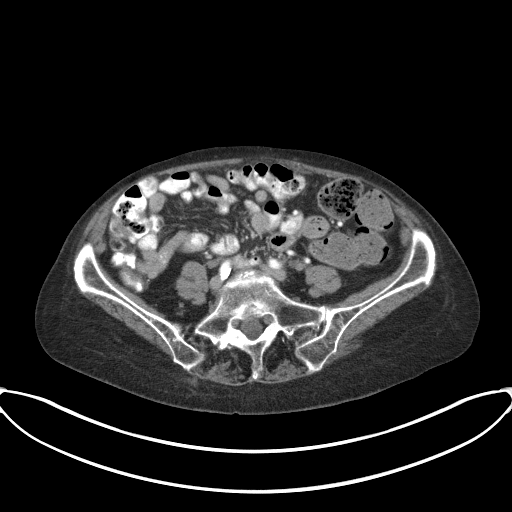
[im 96/204  soft-tissue]
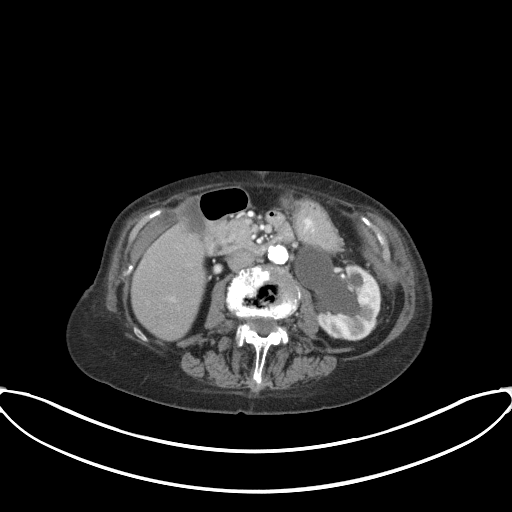
[im 108/204  soft-tissue]
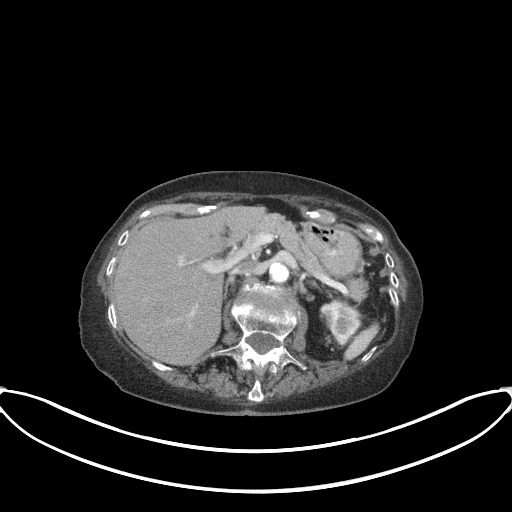
[im 132/204  soft-tissue]
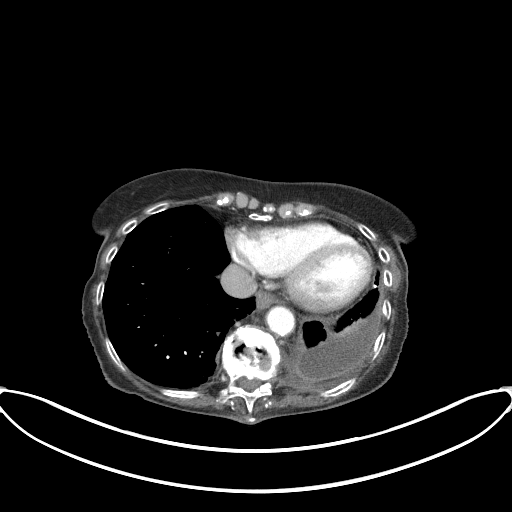
[im 156/204  soft-tissue]
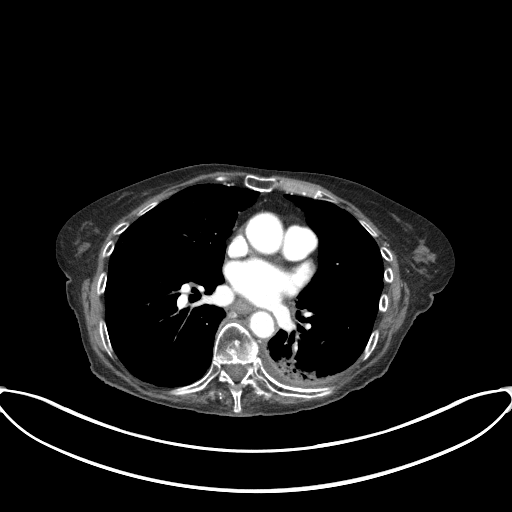
[im 156/204  lung]
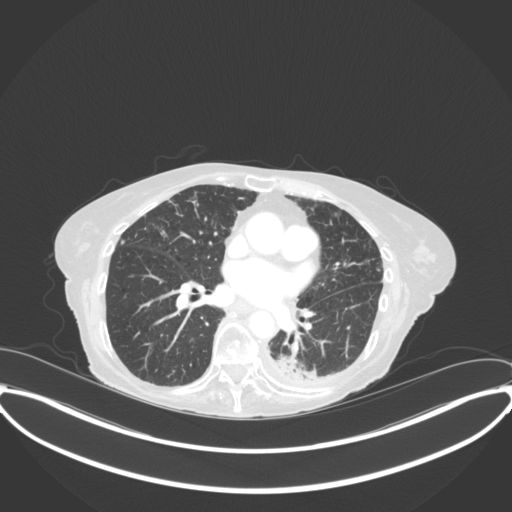
[im 168/204  soft-tissue]
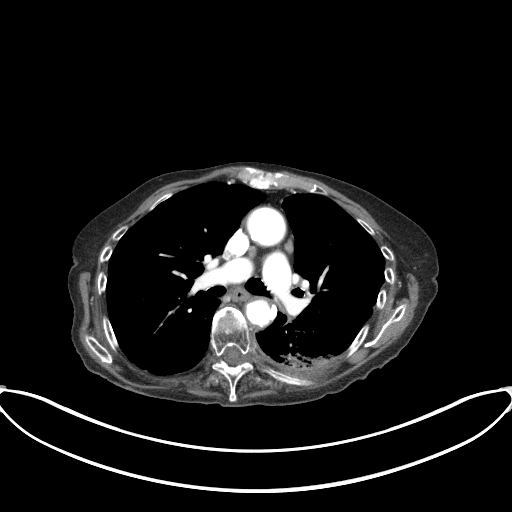
[im 168/204  lung]
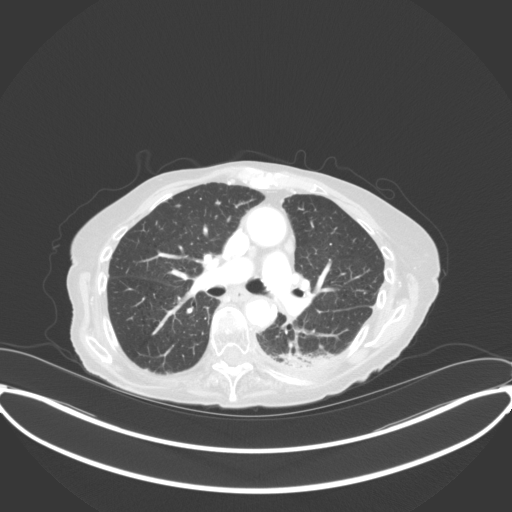
[im 168/204  bone]
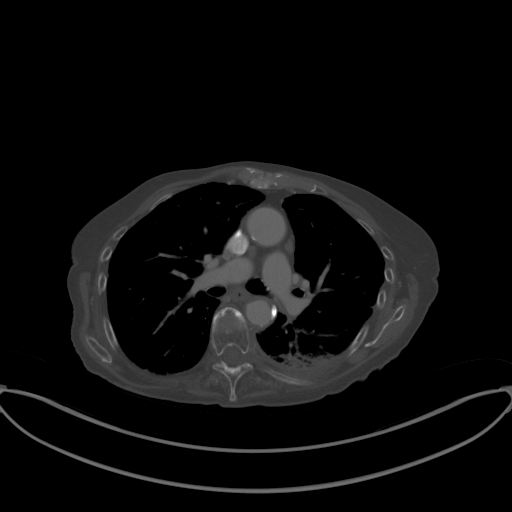
[im 180/204  lung]
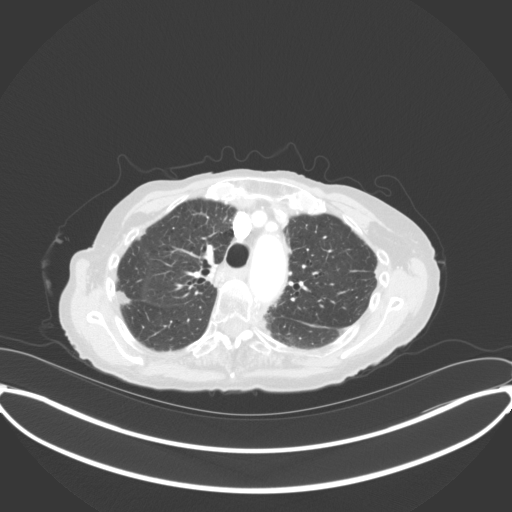
[im 192/204  soft-tissue]
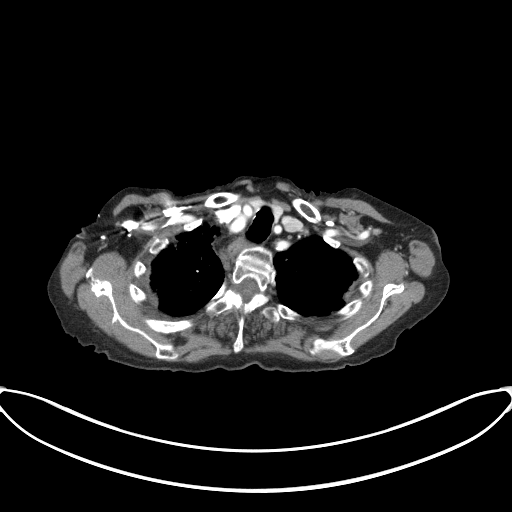
[im 192/204  lung]
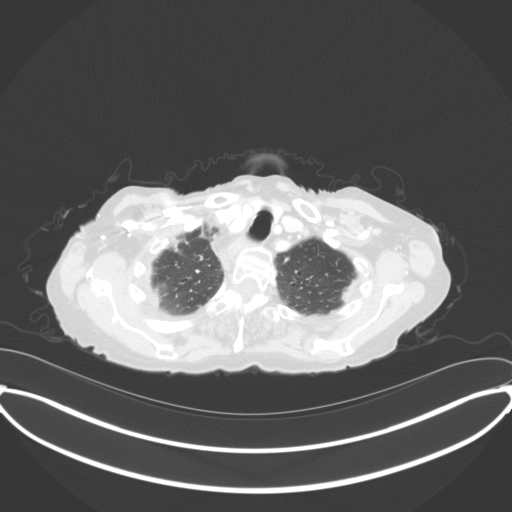

[11 of 32 positions shown; findings below may reference images not displayed]

FINDINGS: CT chest: 
There has been slight interval increase in size of normal in size RIGHT superior 
mediastinal lymph node currently showing short axis of 7.5 mm on axial image 10 
of series 2 versus 6 mm previously. There are no pathologically enlarged lymph 
nodes in the mediastinum, hila or axillary areas. A LEFT chest Mediport is 
present. The heart is normal in overall size. There is an aortic valve 
prosthesis. Coronary artery calcifications incidentally noted. There are no 
incidental present pulmonary emboli. The esophagus is unremarkable. There is a 
persistent small loculated appearing LEFT basilar pleural effusion with adjacent 
chronic appearing consolidation along the posterior aspect of the LEFT lower 
lobe. There is persistent effacement of the normal fat in the LEFT inferior 
thoracic paraspinous area which is stable and which was one of the sites of the 
previous lymphoma. There is persistent bronchiolitis and bronchiectasis in the 
inferior RIGHT middle lobe and inferior lingula. A few small foci of 
bronchiolitis are also in the RIGHT lower lobe. There are a few small foci of 
endobronchial secretion in the LEFT upper lobe. There is bilateral apical 
pleural-parenchymal scarring. There are new compression fractures of T11 and T12 
which have been treated with vertebroplasty. 
CT abdomen pelvis: 
The spleen is normal in size and stable. There is effacement of normal fat along 
the RIGHT internal iliac vessels which is stable and represents chronic sequela 
of previously treated lymphoma at this location. No pathologically enlarged 
abdominal, pelvic or inguinal lymph nodes are found. The spleen is normal in 
size and stable. There is a small cyst in the superior RIGHT lobe of the liver. 
Liver is otherwise unremarkable. The gallbladder, biliary tract, pancreas, and 
adrenal glands are within normal limits. Multiple parenchymal scars are 
bilaterally in the kidneys. There is moderate to severe LEFT hydronephrosis 
without associated dilated ureter which has come and gone on prior exams most 
likely representing intermittent chronic ureteropelvic junction obstruction. 
This has substantially increased in relation to the most recent exam from November 16, 2021 but appears similar to an older exam from December 10, 2019. There is a 
probable hemorrhagic cyst projecting from the medial lower pole of the LEFT 
kidney. Urinary bladder is unremarkable. Uterus is normal in size for age. There 
is uncomplicated diverticulosis in the colon. No acute GI tract pathology is 
found. Abdominal aorta is normal in caliber. Moderate to severe atherosclerotic 
calcifications are present. There is no ascites. There is are stable compression 
fractures of L1 and L5 treated with vertebroplasty. There is mild stable 
superior endplate compression fracture of L4.
IMPRESSION: 1. Although there are no currently present enlarged lymph nodes in the chest, 
abdomen or pelvis, there is a lymph node in the RIGHT superior mediastinum 
which, although it is not enlarged, has shown slight interval increase in size 
and should be watched closely in the future. The stable sequela of treated 
lymphoma in the LEFT inferior thoracic paraspinal space and along the RIGHT 
internal iliac vessels in the pelvis has shown long-term stability. 
2. Since the prior exam patient has developed 2 new compression fractures of the 
T11 and T12 vertebral bodies both of which have been treated with 
vertebroplasty. Additional compression fractures in the lumbar spine are stable. 
3. There is persistent chronic active inflammatory pathology in the chest most 
notably with bronchiolitis and bronchiectasis most prominently in the RIGHT 
middle lobe and lingula. There is a stable chronic appearing loculated LEFT 
basilar pleural effusion with chronic atelectasis along the posterior aspect of 
the LEFT lower lobe. 
4. There is moderate to severe dilatation of the LEFT renal calyces and pelvis 
which has come and gone on prior CT exams and most likely represents 
intermittent chronic LEFT ureteropelvic junction obstruction. 
RADIATION DOSE REDUCTION: All CT scans are performed using radiation dose 
reduction techniques, when applicable.  Technical factors are evaluated and 
adjusted to ensure appropriate moderation of exposure.  Automated dose 
management technology is applied to adjust the radiation doses to minimize 
exposure while achieving diagnostic quality images.

## 2022-05-28 IMAGING — NM RENAL SCAN WITH LASIX
1 series · 6 of 6 positions shown · non-contrast
Comparison: none

________________________________________________________________________________________________ 
RENAL SCAN WITH LASIX, 05/28/2022 [DATE]: 
CLINICAL INDICATION: Incontinence. Back pain. Bloating. Distended left 
collecting system
TECHNIQUE: 4.6 millicuries of JIM technetium 99m MAG3 was injected intravenously 
with differential arterial flow and function curves generated for each kidney. 
40 mg of Lasix was intravenously injected 10 minutes post MAG3 injection.

[Series 1000: renal w/lasix · 7.79mm/px · 6 of 70 frames shown]
[frame 6/70]
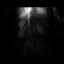
[frame 18/70]
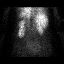
[frame 30/70]
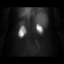
[frame 41/70]
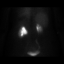
[frame 53/70]
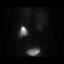
[frame 65/70]
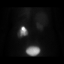

[6 of 6 positions shown; findings below may reference images not displayed]

FINDINGS: The differential function is 54% on the left and 46% on the right. 
The time activity curve on the right is normal in appearance with time to peak 
is 4 minutes and T1/2 from max at 13.3 minutes. The left kidney shows delayed 
uptake with maximum activity at 14 minutes with poor washout. This is suggest 
chronic low-grade obstructive changes. No no T1/2 able to be calculated.
IMPRESSION: Findings suggestive of a chronic obstructive changes on the left. Despite this 
the differential function is higher on the left than right. Suspect chronic at 
least partial left ureter pelvic junction stenosis.

## 2022-08-30 IMAGING — CT CT CHEST/ABDOMEN/PELVIS WITH CONTRAST
2 of 10 series · 10 of 46 positions shown, 16 images · IV contrast (APPLIED)
Comparison: CT scan from 05/01/2022

________________________________________________________________________________________________ 
CT CHEST/ABDOMEN/PELVIS WITH CONTRAST, 08/30/2022 [DATE]: 
CLINICAL INDICATION: Diffuse large B-cell lymphoma. Chronic bloating. History of 
multiple squamous cell carcinomas most recent April 2022 right shoulder. 
A search for DICOM formatted images was conducted for prior CT imaging studies 
completed at a non-affiliated media free facility.
TECHNIQUE: The chest, abdomen and pelvis were scanned from base of neck through 
the pubic rami 75 mL of Isovue 300 MDV injected intravenously on a high 
resolution low dose CT scanner. 0 Routine MPR and MIP 3D renderings performed 
with concurrent physician supervision.

[Series 7: coronal · coronal · 0.67mm/px · 2 of 129 slices shown, 3 images]
[im 43/129  soft-tissue]
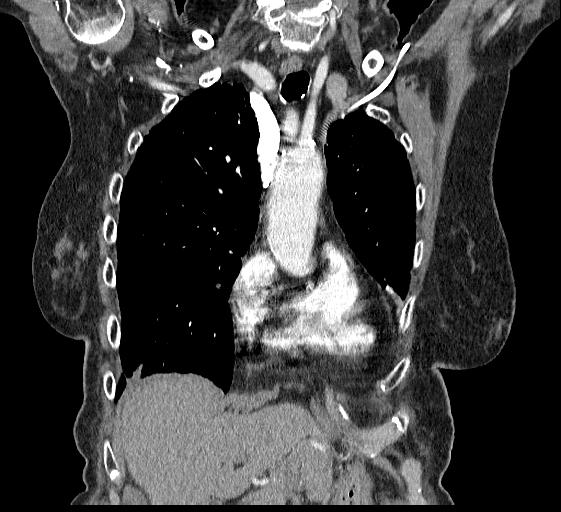
[im 43/129  bone]
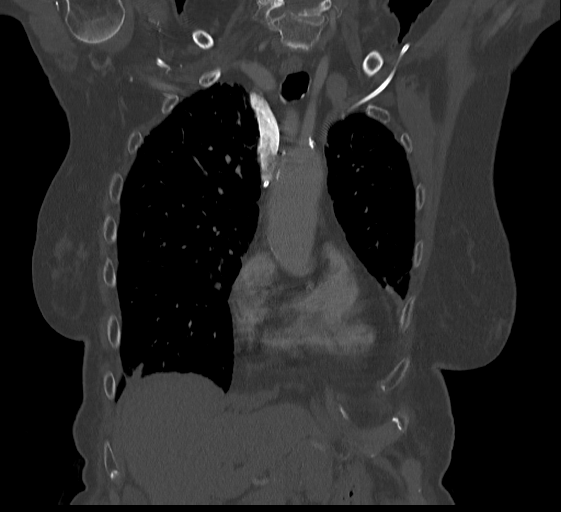
[im 86/129  soft-tissue]
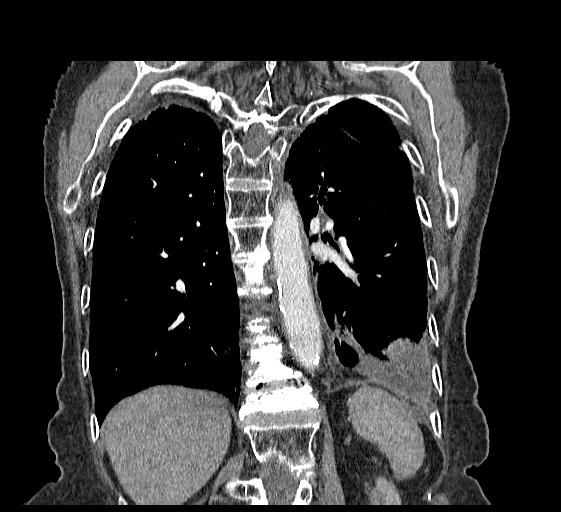

[Series 9: portal 3.0 i41s 2 · axial · portal-venous · 0.74mm/px · z∈[-638,-290]mm · 8 of 150 slices shown, 13 images]
[im 17/150  soft-tissue]
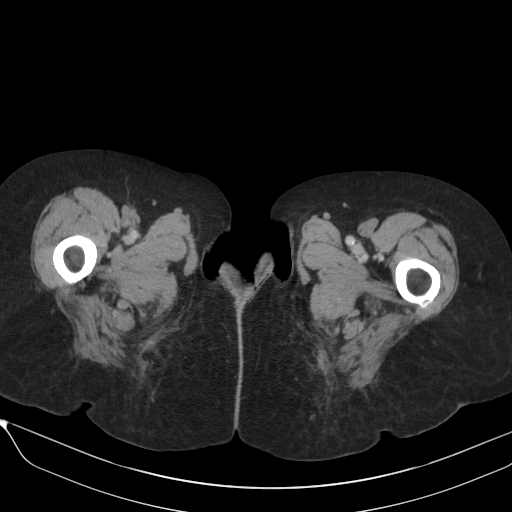
[im 17/150  bone]
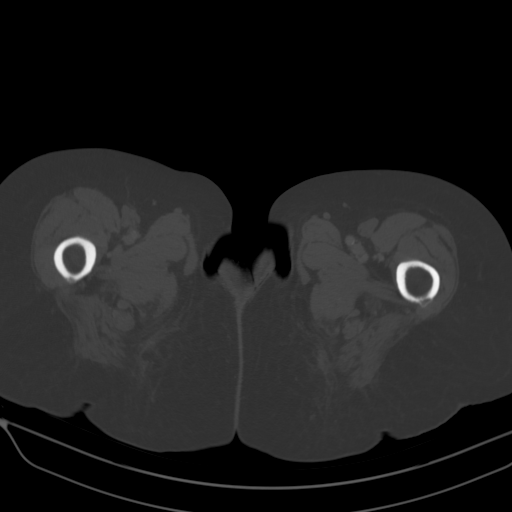
[im 34/150  soft-tissue]
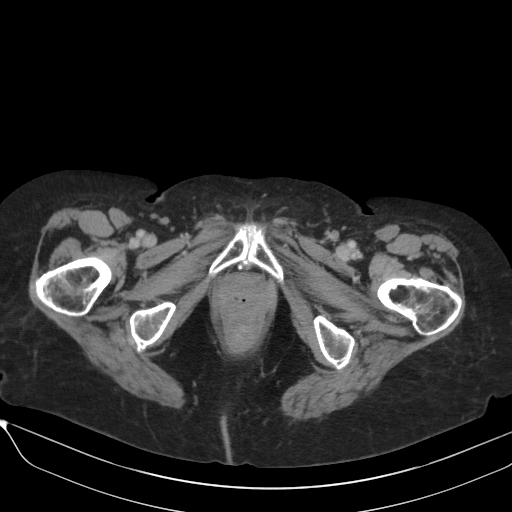
[im 50/150  soft-tissue]
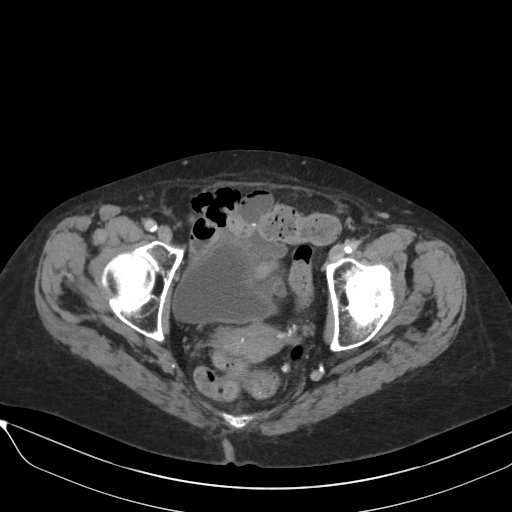
[im 67/150  soft-tissue]
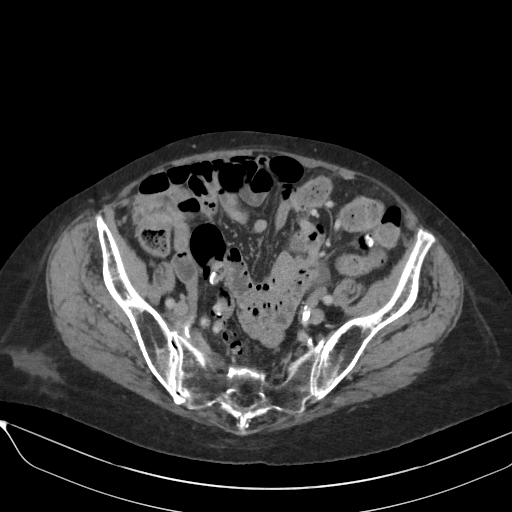
[im 83/150  soft-tissue]
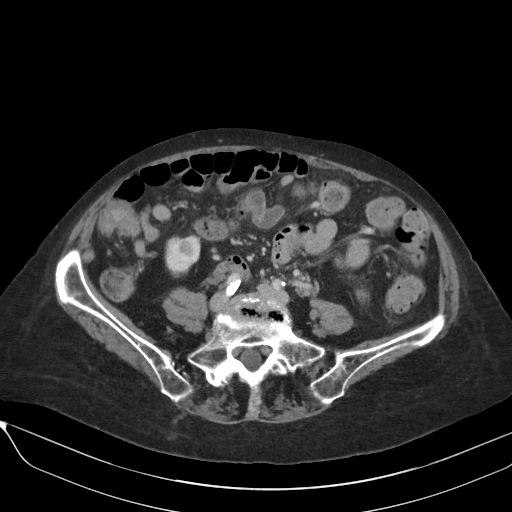
[im 83/150  lung]
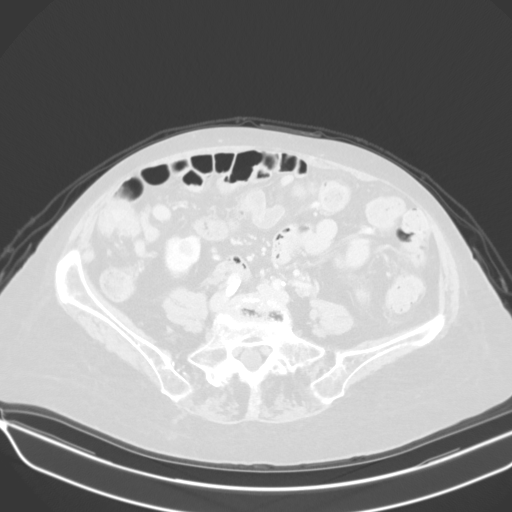
[im 100/150  soft-tissue]
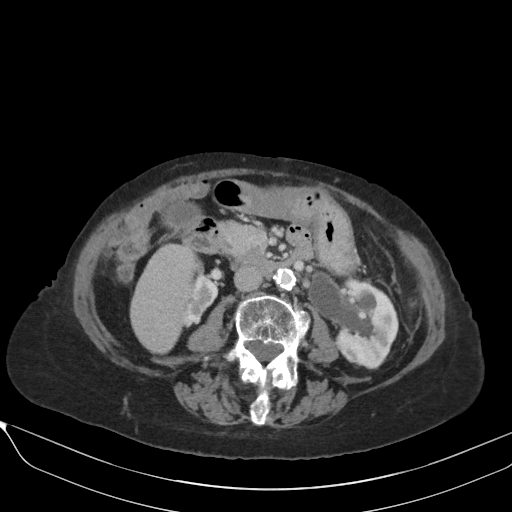
[im 100/150  lung]
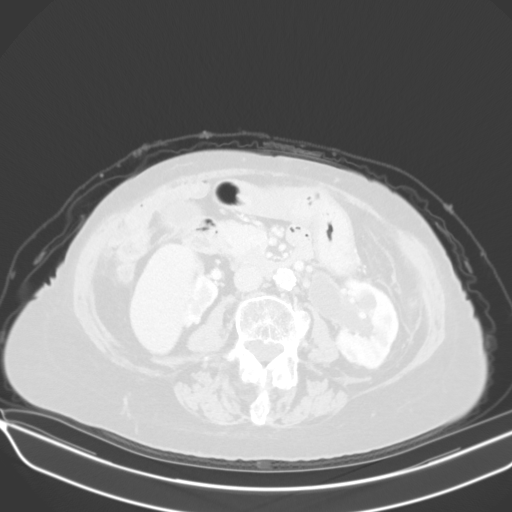
[im 116/150  soft-tissue]
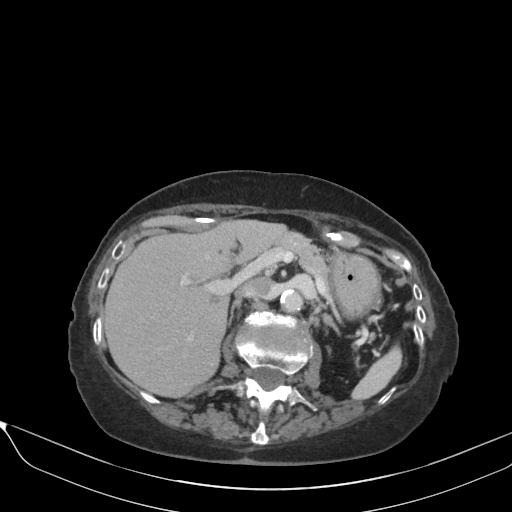
[im 116/150  lung]
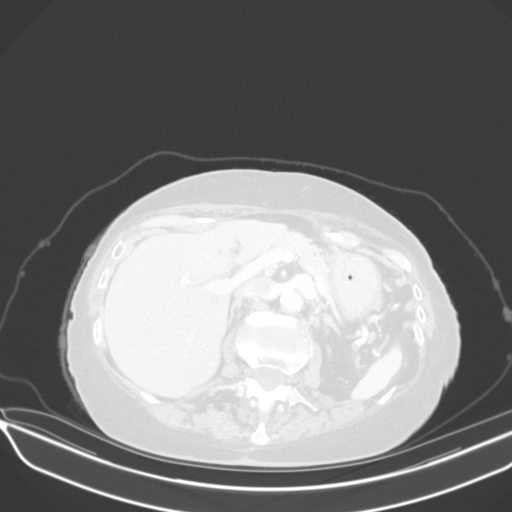
[im 133/150  soft-tissue]
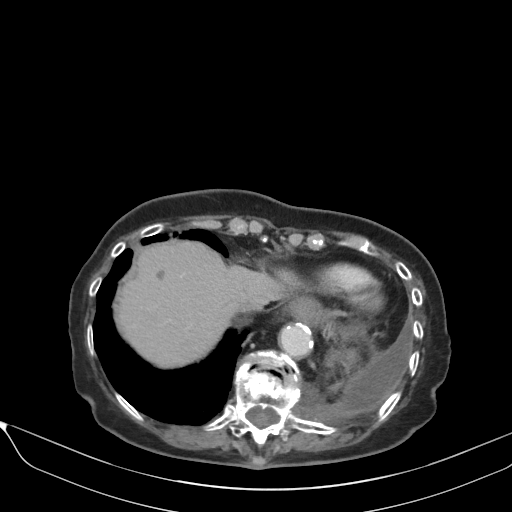
[im 133/150  lung]
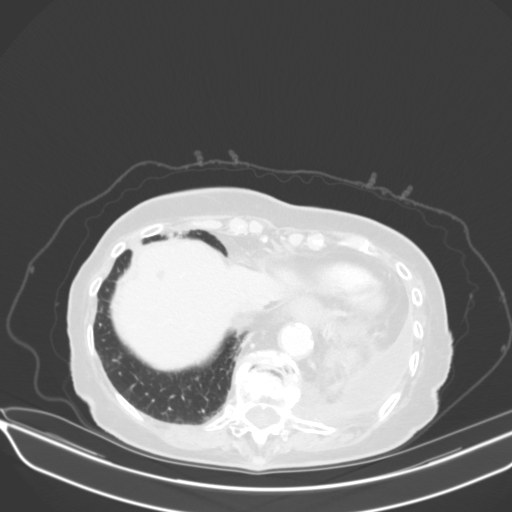

[10 of 46 positions shown; findings below may reference images not displayed]

FINDINGS: LUNGS AND PLEURA:  Stable scarring in the lung apices. Stable partial collapse 
left lower lobe with a chronic partially loculated left pleural effusion. 
Bronchiectatic changes and inflammatory changes seen within the lingula and 
right middle lobe are again identified. There are some new areas of mucus within 
the bronchi extending into the left upper lobe, example on image 44. No new 
suspicious mass identified. 
MEDIASTINUM:  Postoperative changes with aortic valve repair. No new or 
suspicious adenopathy. The superior mediastinal lymph node described previously 
with a short axis of 7 mm is thought to be unchanged. The left paraspinal soft 
tissue difficult to define with the adjacent partially collapsed lung and 
chronic left pleural effusion is thought to be grossly stable compared to the 
exam May 01, 2022. Moderate coronary calcifications. 
CHEST WALL/AXILLA: There is a left-sided port. There is no adenopathy. No new 
mass identified. 
HEPATOBILIARY: There are stable suspected cyst within the liver. No gallstones. 
SPLEEN: Normal in size. 
PANCREAS: No ductal dilatation or mass.   
ADRENALS: No mass. 
GENITOURINARY: Cortical atrophy is again identified as well as a simple cyst. 
Persistent fullness of the left kidney which is been noted to [employer] over 
time. Similar if not mildly improved compared to most recent CT scan. No bladder 
mass. 
LYMPH NODES: No adenopathy. Stable soft tissue adjacent to the right internal 
iliac vessels. 
STOMACH, SMALL BOWEL AND COLON: No bowel wall thickening or obstruction. 
VASCULAR STRUCTURES: Atherosclerotic changes without aneurysmal dilatation.  
MUSCULOSKELETAL: Degenerative changes, osteopenia and multiple compression 
fractures previously treated with vertebroplasty similar to the prior exam.  
ADDITIONAL FINDINGS: Uterus and adnexa are unremarkable in appearance.
IMPRESSION: Stable superior mediastinal lymph node described on the most recent exam. Stable 
soft tissue along the left lateral aspect of the lower thoracic spine grossly 
similar to exam 05/01/2022. Stable soft tissue adjacent to the right internal 
iliac vessels. 
Chronic changes with chronic rounded atelectasis left lung base and chronic left 
pleural effusion. Slightly progressive mucus plugging left upper lobe otherwise 
stable inflammatory changes within the lingula and right middle lobe. 
Emphysematous changes, atherosclerotic changes and degenerative changes. 
Multiple compression deformities treated with previous vertebroplasty. Diffuse 
osteopenia. 
Stable chronic changes involving the kidneys. 
RADIATION DOSE REDUCTION: All CT scans are performed using radiation dose 
reduction techniques, when applicable.  Technical factors are evaluated and 
adjusted to ensure appropriate moderation of exposure.  Automated dose 
management technology is applied to adjust the radiation doses to minimize 
exposure while achieving diagnostic quality images.

## 2023-02-01 IMAGING — CT CT CHEST/ABDOMEN/PELVIS WITH CONTRAST
2 of 9 series · 11 of 46 positions shown, 17 images · IV contrast (APPLIED)
Comparison: CT 08/30/2022.

________________________________________________________________________________________________ 
CT CHEST/ABDOMEN/PELVIS WITH CONTRAST, 02/01/2023 [DATE]: 
CLINICAL INDICATION: Diffuse large cell B-cell lymphoma, intrathoracic 
adenopathy. 
A search for DICOM formatted images was conducted for prior CT imaging studies 
completed at a non-affiliated media free facility.
TECHNIQUE: The chest, abdomen and pelvis were scanned from base of neck through 
the pubic rami 75 mL of Isovue 300 MDV injected intravenously on a high 
resolution low dose CT scanner. Routine MPR and MIP reconstruction images were 
performed. The patients eGFR was calculated to be 59.1 mL/min/1.73 m2 using the 
i-STAT device.

[Series 9: portal 3.0 i41s 2 · axial · portal-venous · 0.69mm/px · z∈[-660,-291]mm · 9 of 155 slices shown, 15 images]
[im 16/155  soft-tissue]
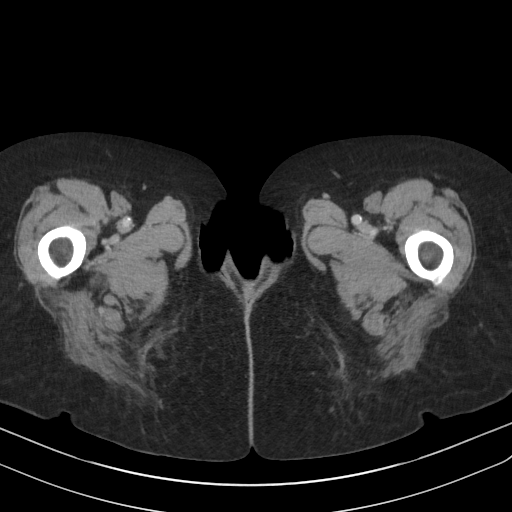
[im 16/155  bone]
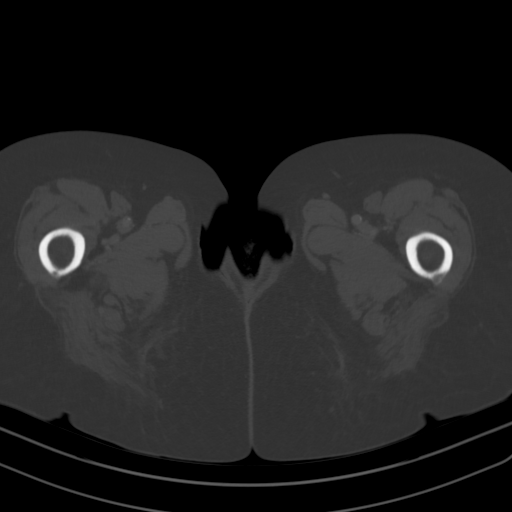
[im 31/155  soft-tissue]
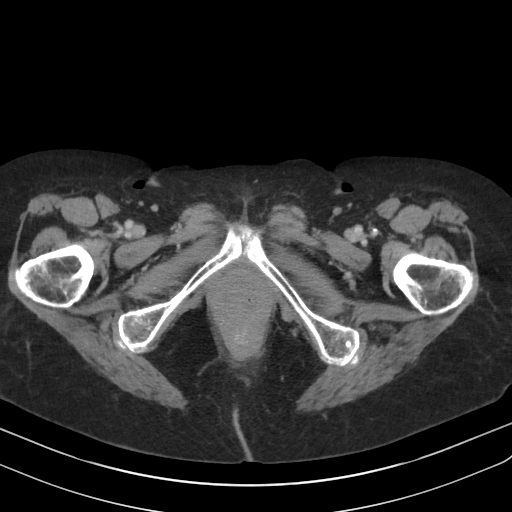
[im 47/155  soft-tissue]
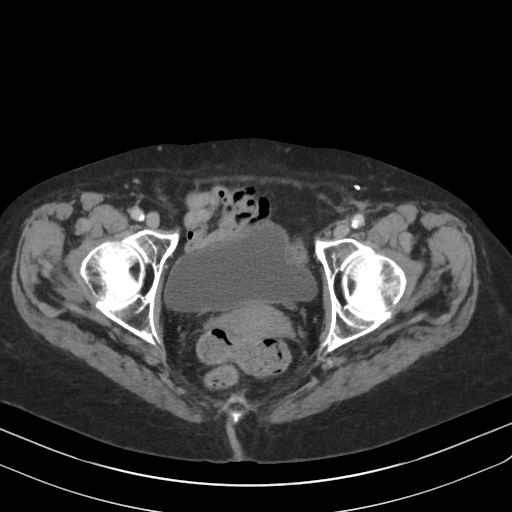
[im 62/155  soft-tissue]
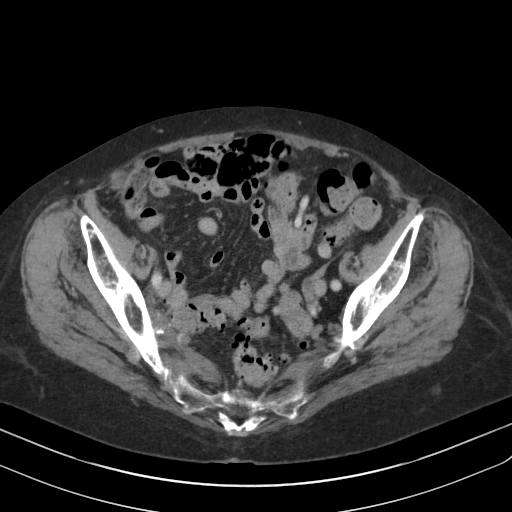
[im 78/155  soft-tissue]
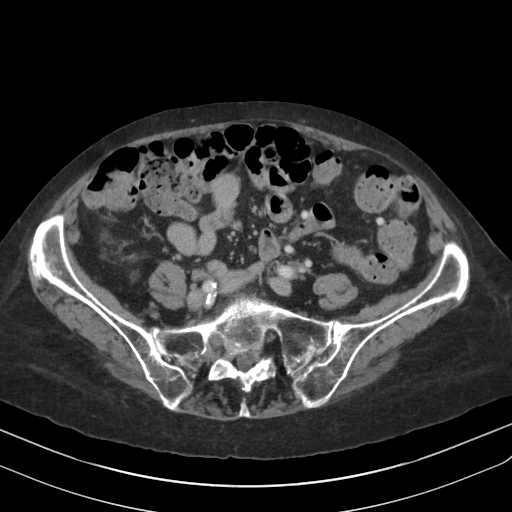
[im 93/155  soft-tissue]
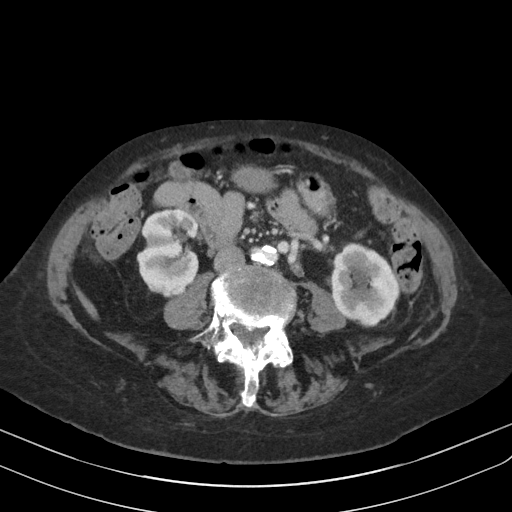
[im 93/155  lung]
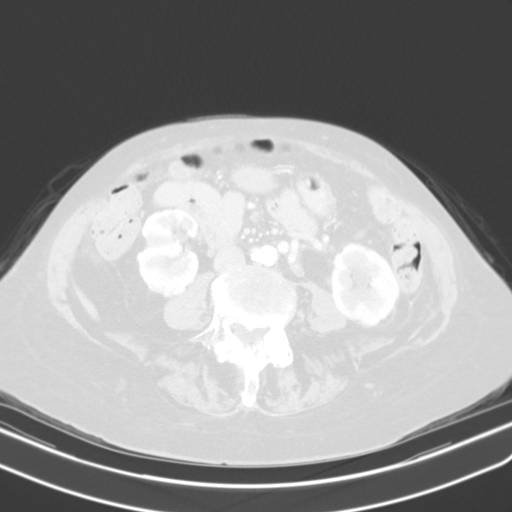
[im 108/155  soft-tissue]
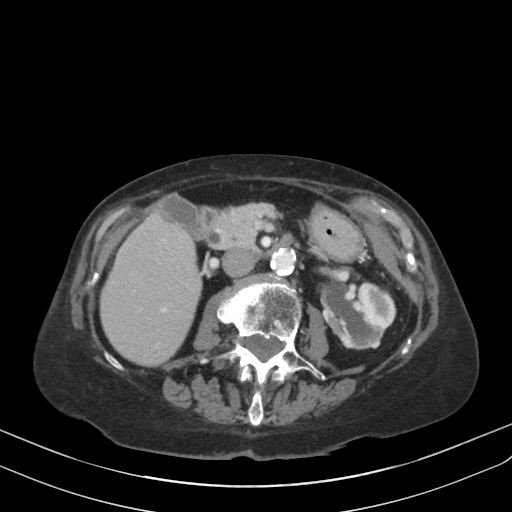
[im 108/155  lung]
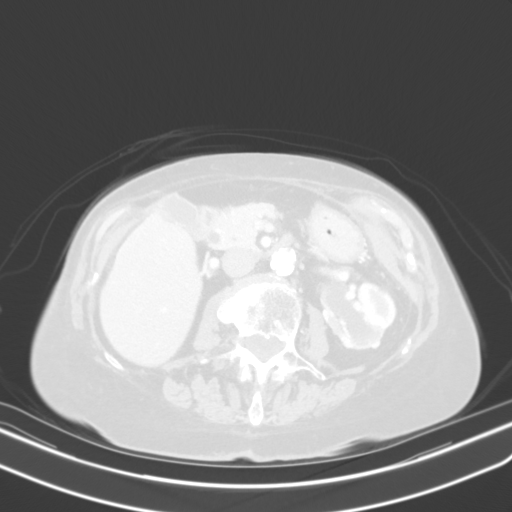
[im 124/155  soft-tissue]
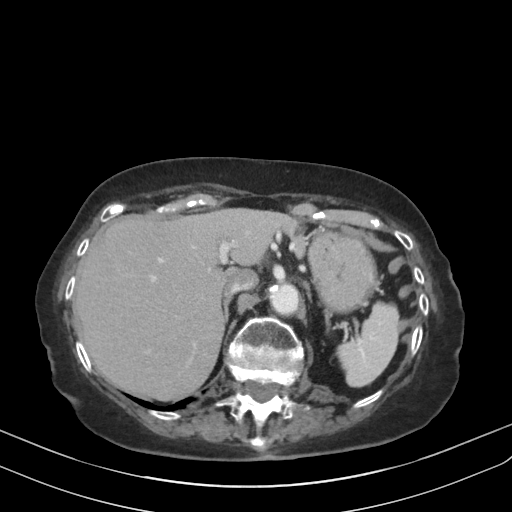
[im 124/155  lung]
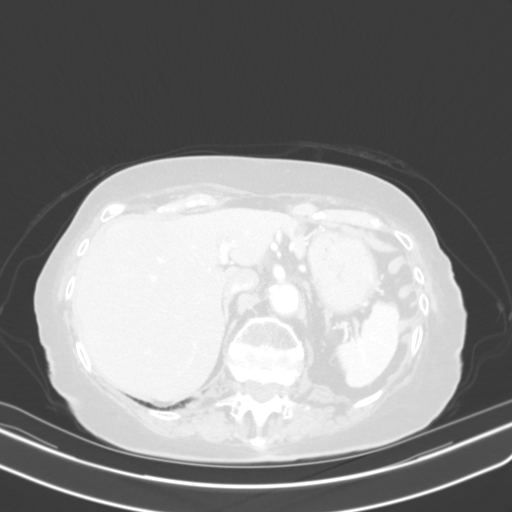
[im 139/155  soft-tissue]
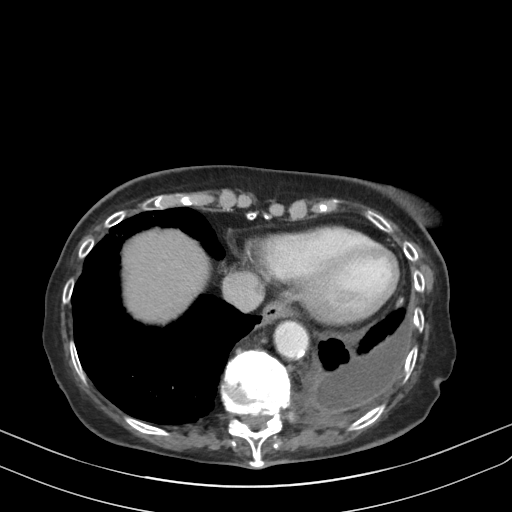
[im 139/155  lung]
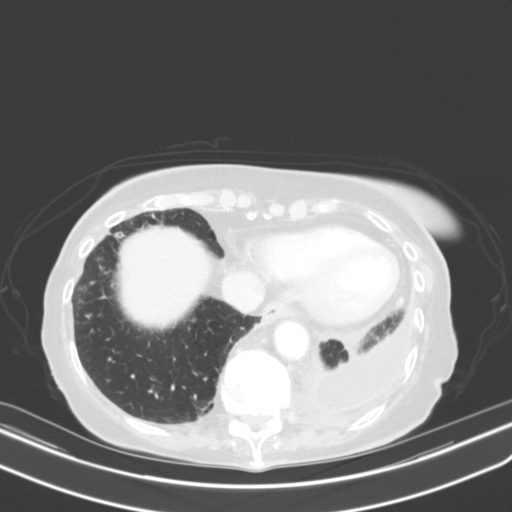
[im 139/155  bone]
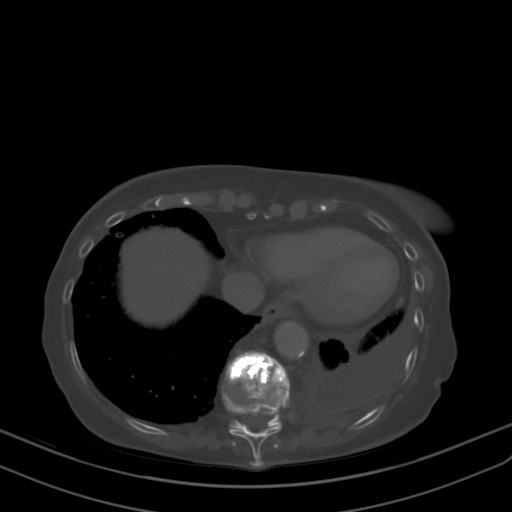

[Series 13: coronal 10 min delay · coronal · delayed · 0.68mm/px · 2 of 149 slices shown]
[im 50/149  soft-tissue]
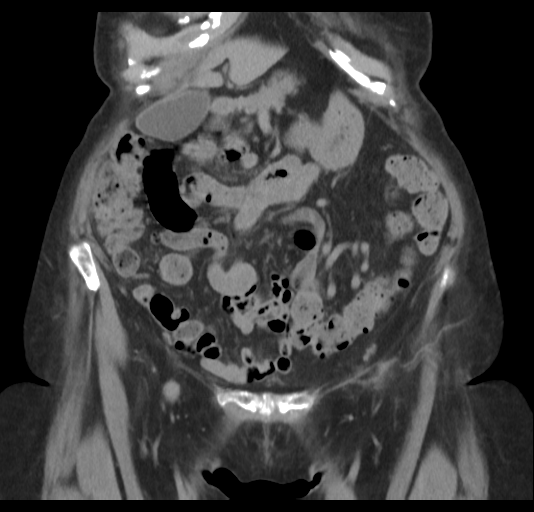
[im 99/149  soft-tissue]
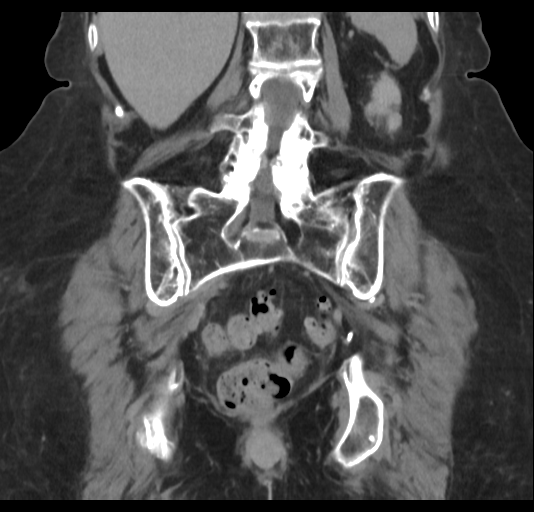

[11 of 46 positions shown; findings below may reference images not displayed]

Count of known CT and Cardiac Nuclear Medicine studies performed in the previous 
12 months = 4.
FINDINGS: LUNGS AND PLEURA:  Stable scarring in the lung apices. Stable partial collapse 
left lower lobe with a chronic partially loculated left pleural effusion. 
Bronchiectasis with inflammatory changes seen within the lingula and right 
middle lobe. Scattered mucous plugging. No new suspicious pulmonary mass or 
acute consolidation. 
MEDIASTINUM:  Postoperative changes with aortic valve repair. No new or 
suspicious adenopathy. Stable mediastinal lymph nodes. No new adenopathy left 
paraspinal soft tissue opacity adjacent partially collapsed lung and chronic 
left pleural stable. Moderate coronary calcifications. 
CHEST WALL/AXILLA: There is a left-sided port. There is no adenopathy. No new 
mass identified. 
HEPATOBILIARY: There are stable suspected cyst within the liver. No gallstones. 
SPLEEN: Normal in size. 
PANCREAS: No ductal dilatation or mass.   
ADRENALS: No mass. 
GENITOURINARY: Cortical atrophy is again identified as well as a simple cyst. 
Fullness of the left collecting system and ureter persists and appears similar 
to the previous exam. No right hydronephrosis. No bladder mass. 
LYMPH NODES: No adenopathy. Stable soft tissue adjacent to the right internal 
iliac vessels. 
STOMACH, SMALL BOWEL AND COLON: No bowel wall thickening or obstruction. 
VASCULAR STRUCTURES: Atherosclerotic changes without aneurysmal dilatation.  
MUSCULOSKELETAL: Degenerative changes, osteopenia and multiple compression 
fractures previously treated with vertebroplasty similar to the prior exam.  
ADDITIONAL FINDINGS: Uterus and adnexa are unremarkable in appearance.
IMPRESSION: Stable soft tissue opacity adjacent to the left lateral aspect of the lower 
thoracic spine . Cyst appears similar to exam 05/01/2022.  
Stable soft tissue adjacent to the right internal iliac vessels. 
Stable mediastinal lymph nodes. No new thoracic, abdominal or pelvic adenopathy 
Stable chronic left effusion and associated compressive atelectasis in the left 
lower lobe. 
Persistent mucous plugging with scattered areas of atelectasis that are greatest 
within the lingula and right middle lobe. 
Stable prominence left collecting system and ureter. 
In patients between the ages of 50-77 where pulmonary emphysema is noted on CT, 
recommend evaluation for low dose lung cancer screening protocol if patient is 
not already enrolled; as pulmonary emphysema is an independent risk factor for 
lung cancer. 
RADIATION DOSE REDUCTION: All CT scans are performed using radiation dose 
reduction techniques, when applicable.  Technical factors are evaluated and 
adjusted to ensure appropriate moderation of exposure.  Automated dose 
management technology is applied to adjust the radiation doses to minimize 
exposure while achieving diagnostic quality images.

## 2023-06-01 IMAGING — MR MRI LUMBAR SPINE WITHOUT CONTRAST
6 of 9 series · 13 of 48 positions shown · IV contrast (gadolinium)
Comparison: CTA chest abdomen and pelvis February 01, 2023. MRI lumbar spine December 09, 2021.

________________________________________________________________________________________________ 
MRI LUMBAR SPINE WITHOUT CONTRAST, 06/01/2023 [DATE]: 
CLINICAL INDICATION: Wedge Compression Fracture Of Unspecified Lumbar Vertebra, 
I , chronic low back pain. Recent x-ray demonstrated a new compression fracture. 
Prior lumbar vertebroplasty procedures. History of lymphoma. Evaluate acuity of 
L4 compression fracture.
TECHNIQUE: Multiplanar, multiecho position MR images of the lumbar spine were 
performed without intravenous gadolinium enhancement. Patient was scanned on a 
1.5T magnet

[Series 101: survey · axial · 10.0mm · 1.25mm/px · z∈[-33,+201]mm · 2 of 10 slices shown]
[im 1/10]
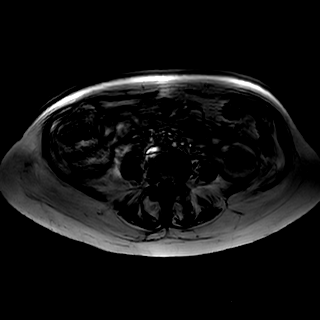
[im 10/10]
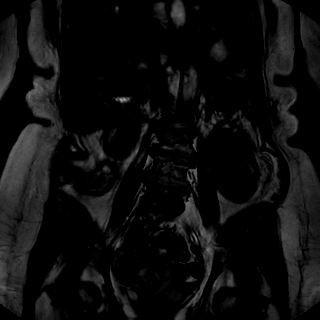

[Series 201: t2w_cor-surv · coronal · 6.0mm · 0.62mm/px · 2 of 10 slices shown]
[im 1/10]
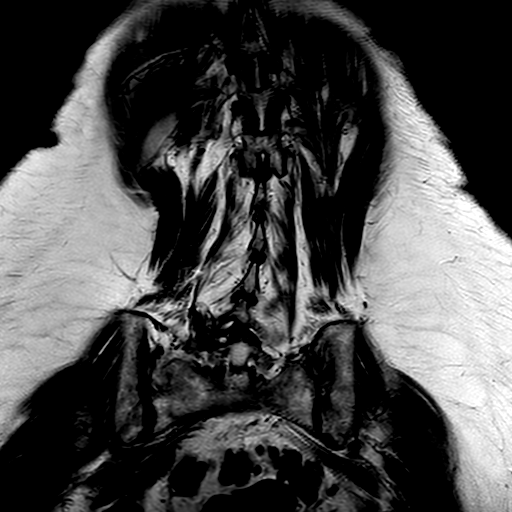
[im 10/10]
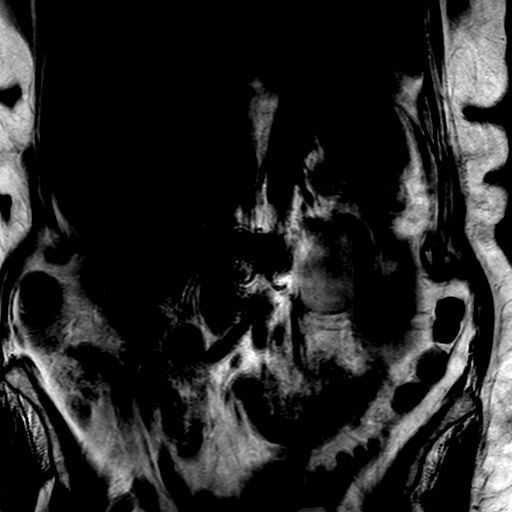

[Series 301: t1_tse_sag · sagittal · 4.0mm · 0.34mm/px · 3 of 19 slices shown]
[im 1/19]
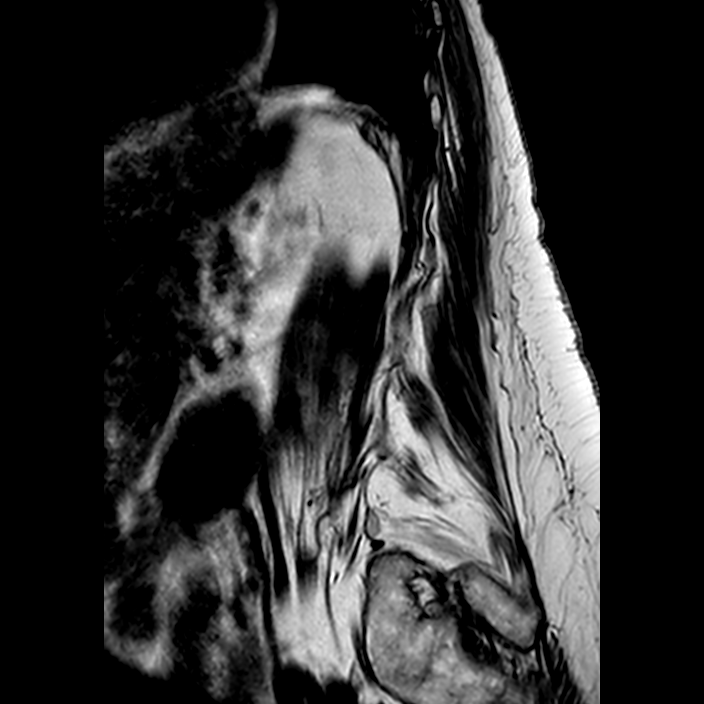
[im 10/19]
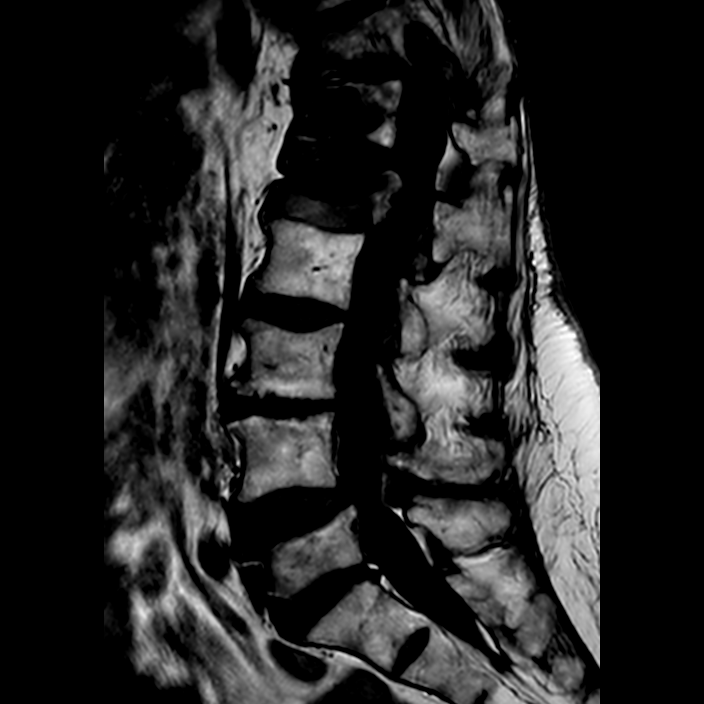
[im 19/19]
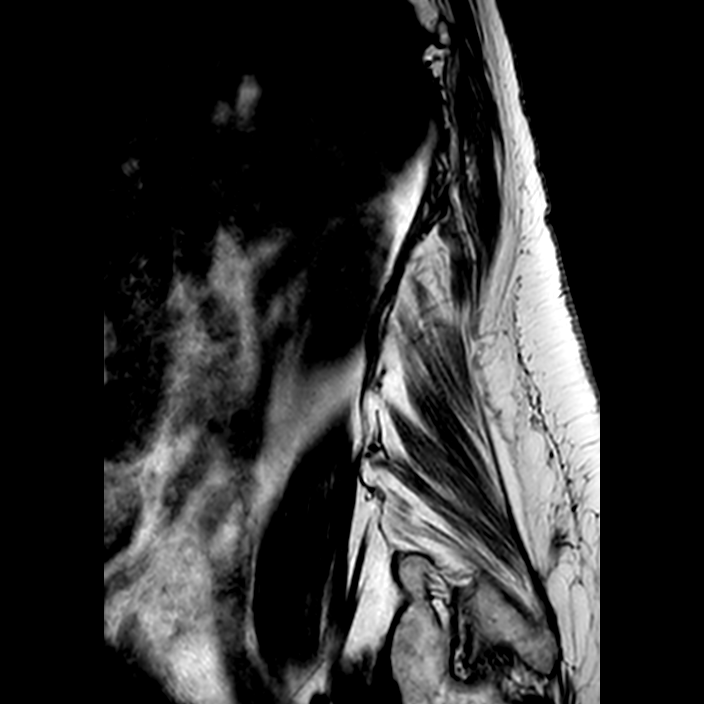

[Series 401: STIR · sagittal · 4.0mm · 0.47mm/px · 2 of 19 slices shown]
[im 1/19]
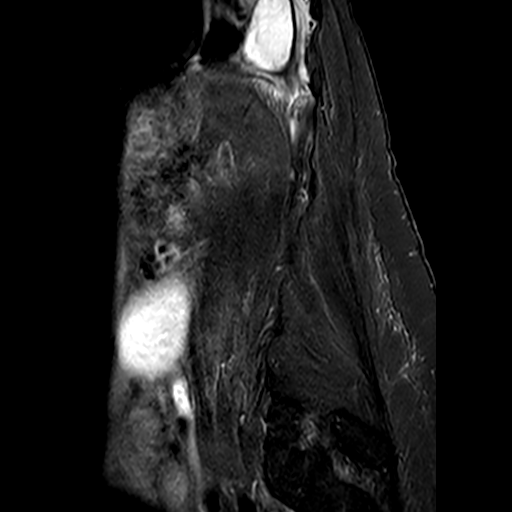
[im 19/19]
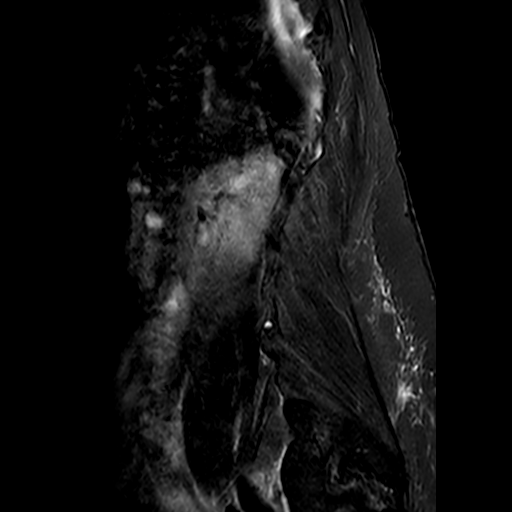

[Series 502: (id)_mdixon_tse · sagittal · 4.0mm · 0.51mm/px · 2 of 19 slices shown]
[im 1/19]
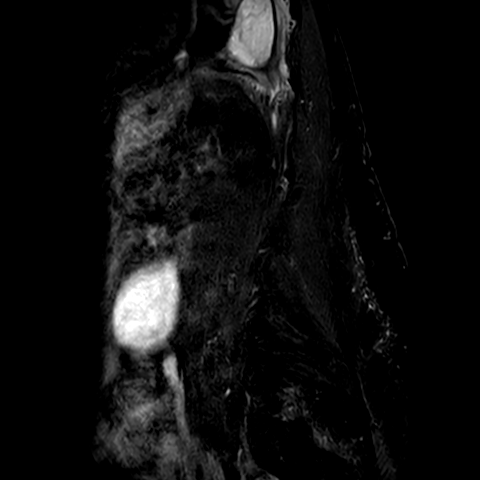
[im 19/19]
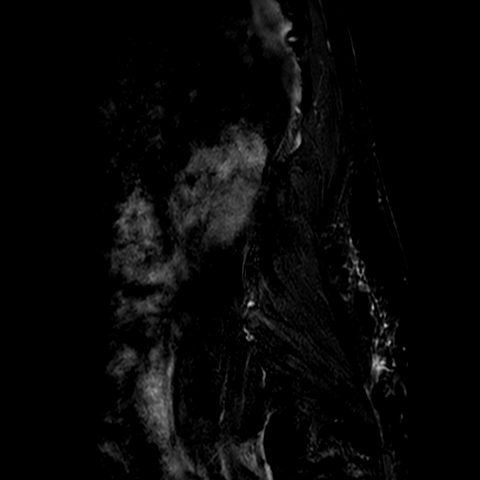

[Series 503: st2w_mdixon_tse · sagittal · 4.0mm · 0.51mm/px · 2 of 19 slices shown]
[im 1/19]
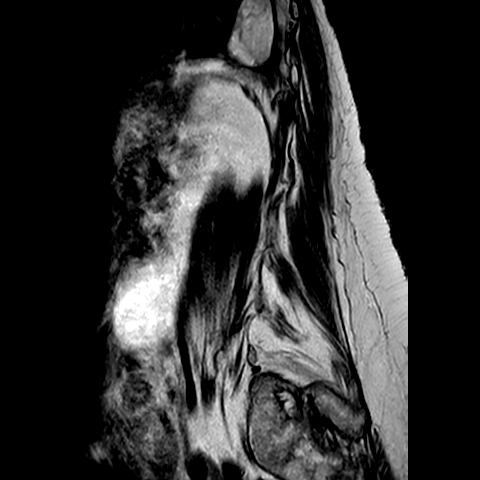
[im 19/19]
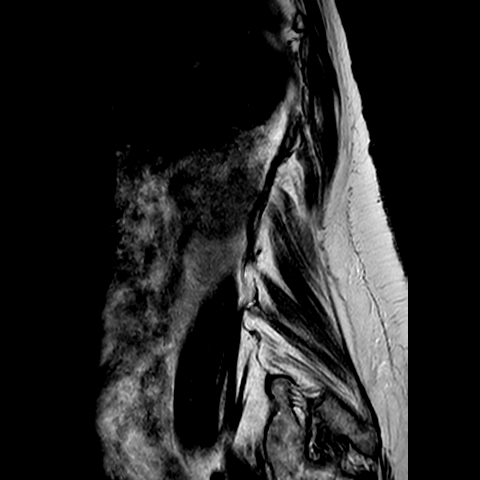

[13 of 48 positions shown; findings below may reference images not displayed]

FINDINGS: -------------------------------------------------------------------------------- 
------ 
GENERAL: 
Nomenclature is based on 5 lumbar type vertebral bodies.     
ALIGNMENT: Mild to moderate levoconvex thoracic lumbar scoliosis. There is 
increasing thoracic lumbar kyphosis related to lower thoracic compression 
fracture deformities. Grade 1 retrolisthesis T11 and T12. Grade 1 
anterolisthesis L4 on L5 is stable. 
VERTEBRAL BODY HEIGHT: Moderately severe chronic appearing T11 compression 
deformity previously treated by vertebroplasty. No marrow edema or osseous 
retropulsion. Stable to prior CT. 
Mild chronic appearing T12 compression deformity with vertebroplasty change. No 
marrow edema or osseous retropulsion. Stable to CT February 01, 2023. 
Severe L1 compression deformity with vertebroplasty change. This is chronic. No 
marrow edema. 10 mm osseous retropulsion posteriorly into the thecal sac is 
similar to the previous CT study and similar to previous MRI. 
Minimal chronic appearing superior endplate L4 compression deformity more 
conspicuous to the left side, stable to previous MRI lumbar spine December 09, 2021. No marrow edema or osseous retropulsion. 
Stable moderate chronic appearing L5 compression deformity with vertebroplasty 
change. No marrow edema or osseous retropulsion.  
MARROW SIGNAL: No focal suspect signal abnormality. 
CORD SIGNAL: Normal distal spinal cord and cauda equina. Conus medullaris 
terminates at L1. 
ADDITIONAL FINDINGS: Moderate elevation left hemidiaphragm with chronic, small 
loculated appearing left basilar pleural effusion. There appears to be severe 
hydronephrosis involving the left kidney. The degree of left-sided renal pelvic 
dilatation is similar to the most recent CT study. Atrophic right kidney.. 
Sacral Tarlov cysts. 
Modic I-II: L3-L4 
Ligamentum Flavum > 2.5 mm: All levels. 
-------------------------------------------------------------------------------- 
------ 
SEGMENTAL: 
T11-T12: Loss of disc height and signal. Grade 1 retrolisthesis T11 and T12. 
Posterior osteophytic ridging. Mild to moderate canal stenosis. Mild/moderate 
bilateral foraminal narrowing. Canal and foraminal stenosis has progressed as 
well as there is increasing kyphosis at this level. 
T12-L1: Ballooning of the disc. Loss of disc signal. Osseous retropulsion 
results in moderate canal stenosis. Foramina patent. Normal facets. Stable. 
L1-L2: Ballooning of the disc. Minimal canal narrowing. Foramina mildly narrowed 
bilaterally. Normal facets. Stable. 
L2-L3: Loss of disc signal. Small right paracentral disc protrusion with slight 
narrowing right lateral recess. Canal otherwise patent. Foramina patent. Normal 
facets. Stable. 
L3-L4: Ballooning of the disc. There is some fluid type signal present within 
the disc space although the endplates appear intact. Canal patent. Facet 
arthropathy and ligamentum flavum hypertrophy. Patent left foramen. Right 
foramen is mildly narrowed. Stable. 
L4-L5: Fluid type signal within the disc. Anterior annular fissure. Mild canal 
stenosis with slight narrowing of the left lateral recess. Facet arthropathy and 
ligamentum flavum hypertrophy. Foramina patent. Posterior osteophytic ridging 
from the superior endplate of L5. Facet arthropathy. Stable. 
L5-S1: Loss of disc signal. Small size central disc extrusion with annular 
fissure. Canal patent. Right foramen patent. Right foraminal annular fissure. 
Left foramen is mildly narrowed. Facet arthropathy. Stable. 
-------------------------------------------------------------------------------- 
------
IMPRESSION: No evidence of acute lumbar or lower thoracic compression fracture. 
Increasing thoracic lumbar kyphosis since the prior MRI study. 
T11, T12, L1, L4, and L5 compression fractures appear chronic. Multilevel 
vertebroplasty change as above. 
Severe hydronephrosis is present involving the left kidney although the degree 
of left-sided renal pelvic dilatation appears similar to the most recent CT 
study. The right kidney appears somewhat atrophic. Correlate with renal 
laboratory values. 
T11-T12, mild to moderate canal stenosis with mild to moderate bilateral 
foraminal narrowing, progressed. 
Other stable appearing multilevel lumbar degenerative changes with moderate 
canal stenosis also noted at T12-L1 from osseous retropulsion, although this 
appears stable. 
Findings will be phoned to the referring clinicians office.

## 2023-07-31 IMAGING — CT CT CHEST/ABDOMEN/PELVIS WITH CONTRAST
2 of 7 series · 15 of 46 positions shown, 17 images · IV contrast (APPLIED)
Comparison: Count of known CT and Cardiac Nuclear Medicine studies performed in the previous 
12 months = 4.

________________________________________________________________________________________________ 
CT CHEST/ABDOMEN/PELVIS WITH CONTRAST, 07/31/2023 [DATE]: 
CLINICAL INDICATION: Diffuse large B-cell lymphoma, intrathoracic lymph nodes. 
Low back pain, unspecified. Nicotine dependence, unspecified, in remission. 
Personal history of other malignant neoplasm of skin. Shortness of breath. 
Unspecified hydronephrosis 
A search for DICOM formatted images was conducted for prior CT imaging studies 
completed at a non-affiliated media free facility.
TECHNIQUE: The chest, abdomen and pelvis were scanned from base of neck through 
the pubic rami 75 mL of Isovue 300 MDV injected intravenously on a high 
resolution low dose CT scanner. Routine MPR and MIP reconstruction images were 
performed.

[Series 7: coronal · coronal · 0.57mm/px · 3 of 112 slices shown]
[im 28/112  soft-tissue]
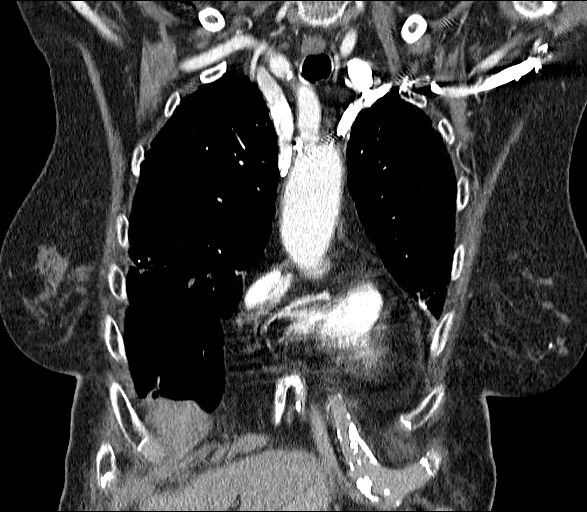
[im 56/112  soft-tissue]
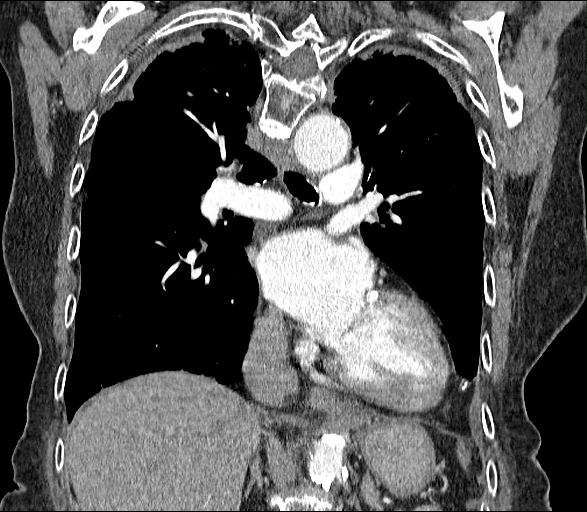
[im 84/112  soft-tissue]
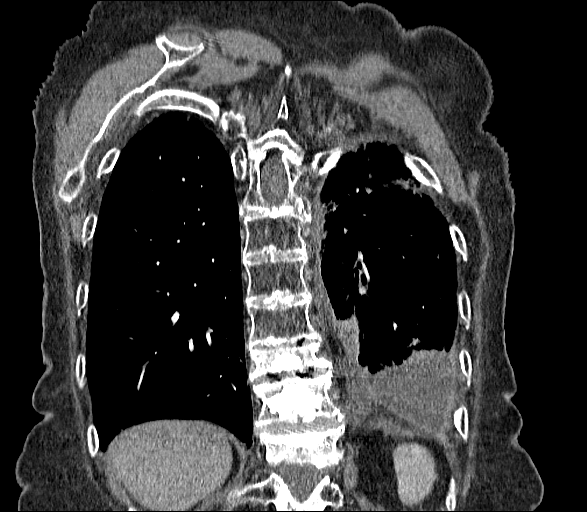

[Series 9: portal 3.0 i41s 2 · axial · portal-venous · 0.67mm/px · z∈[-515,-152]mm · 12 of 145 slices shown, 14 images]
[im 12/145  soft-tissue]
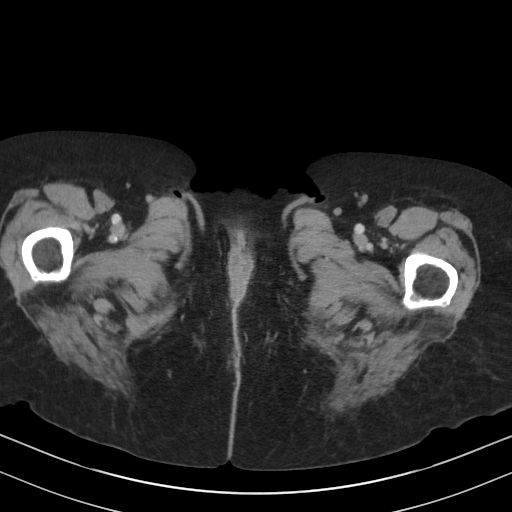
[im 12/145  bone]
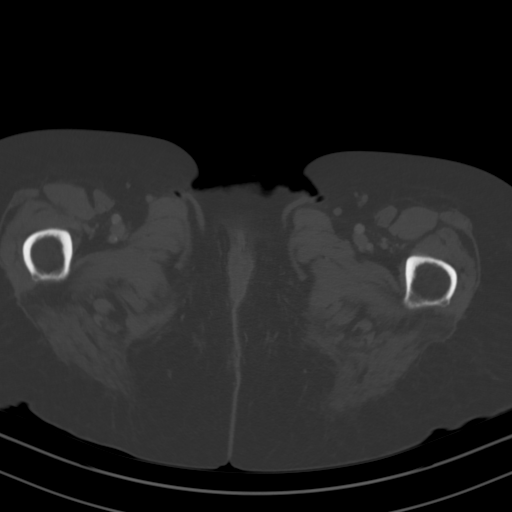
[im 23/145  soft-tissue]
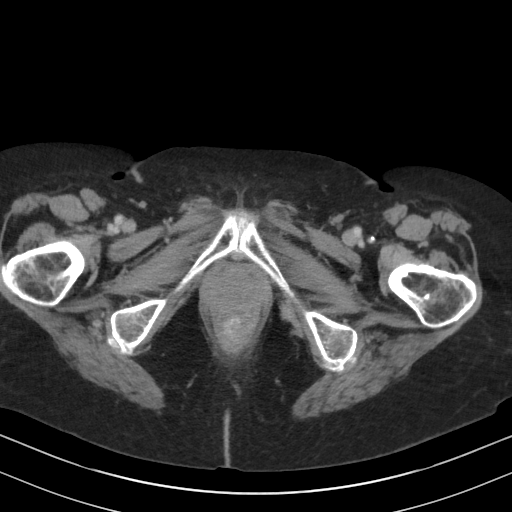
[im 34/145  soft-tissue]
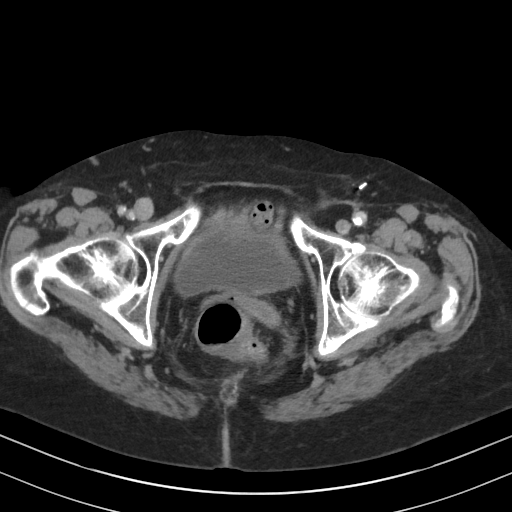
[im 45/145  soft-tissue]
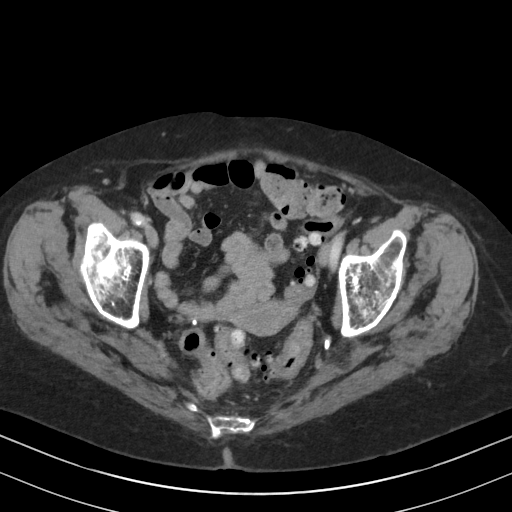
[im 56/145  soft-tissue]
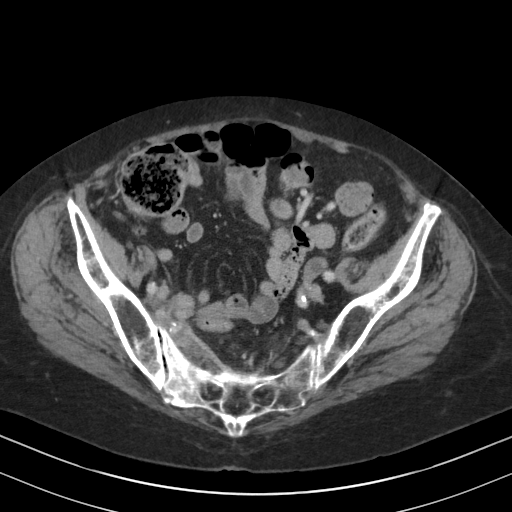
[im 67/145  soft-tissue]
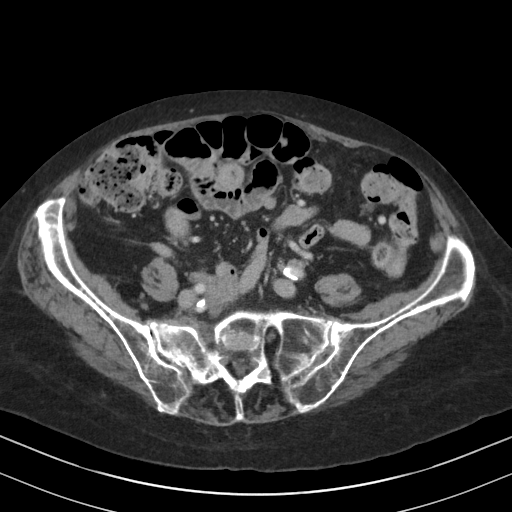
[im 78/145  soft-tissue]
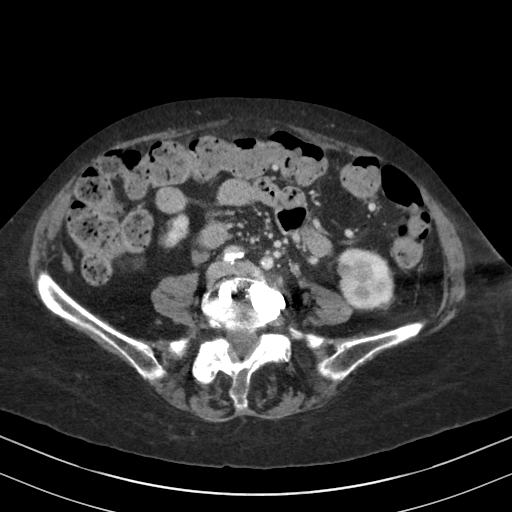
[im 89/145  soft-tissue]
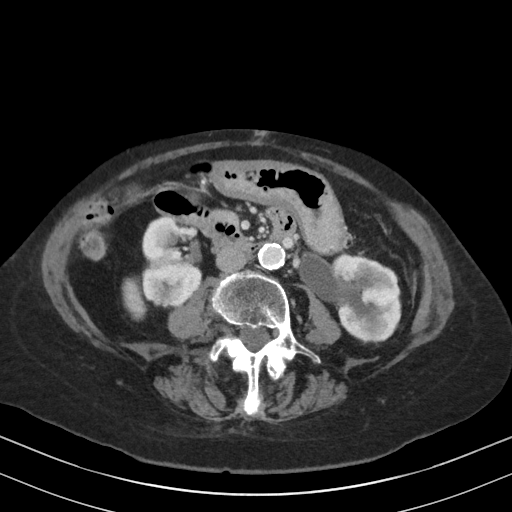
[im 100/145  soft-tissue]
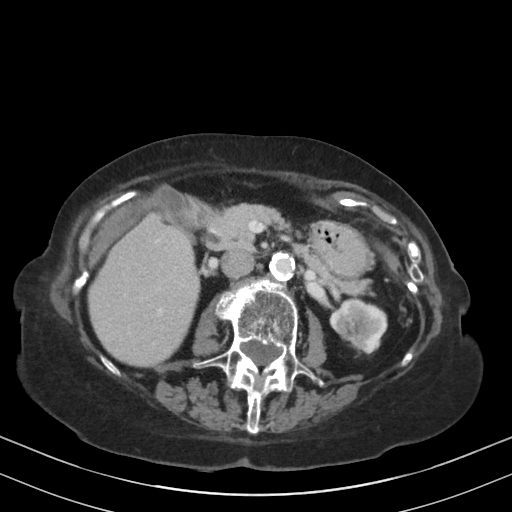
[im 100/145  bone]
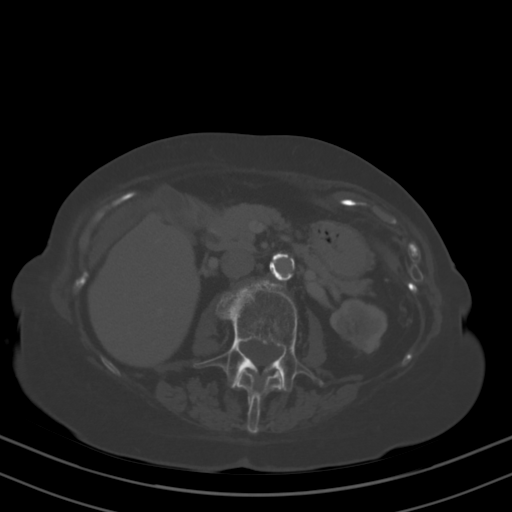
[im 111/145  soft-tissue]
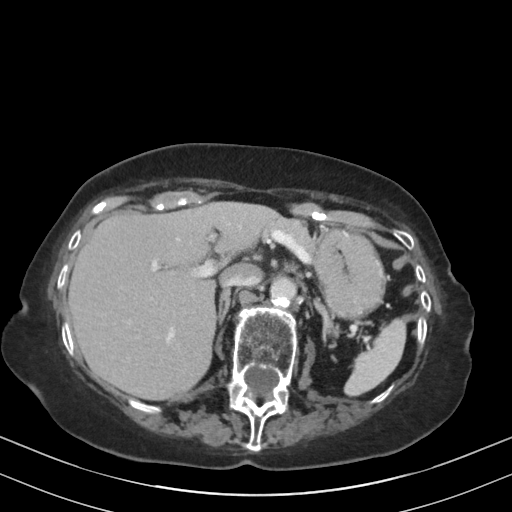
[im 122/145  soft-tissue]
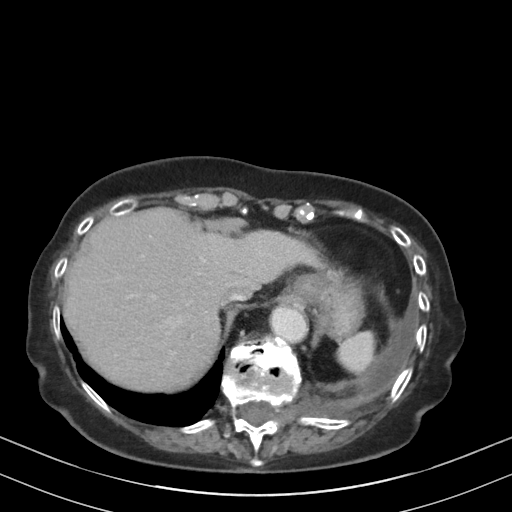
[im 133/145  soft-tissue]
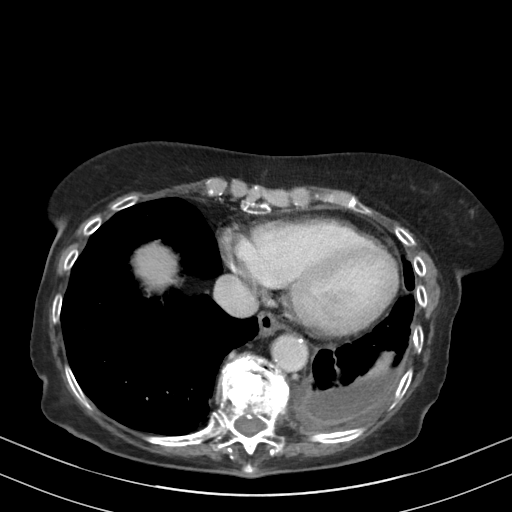

[15 of 46 positions shown; findings below may reference images not displayed]

FINDINGS: LUNGS AND PLEURA:  Stable scarring in the lung apices. Stable partial collapse 
left lower lobe with a chronic partially loculated left pleural effusion. 
Bronchiectasis with inflammatory changes seen within the lingula and right 
middle lobe. Scattered mucous plugging. No new suspicious pulmonary mass or 
acute consolidation. 
MEDIASTINUM:  Postoperative changes with aortic valve repair. No new or 
suspicious adenopathy. Stable mediastinal lymph nodes. No new adenopathy left 
paraspinal soft tissue opacity adjacent partially collapsed lung and chronic 
left pleural stable. Moderate coronary calcifications. 
CHEST WALL/AXILLA: There is a left-sided port. There is no adenopathy. No new 
mass identified. 
HEPATOBILIARY: Stable cyst. No gallstones. 
SPLEEN: Normal in size. 
PANCREAS: No ductal dilatation or mass.   
ADRENALS: No mass. 
GENITOURINARY: Renal cysts and cortical loss are stable. Fullness of the left 
collecting system and ureter persists and appears similar to the previous exam. 
No right hydronephrosis. No bladder mass. 
LYMPH NODES: No adenopathy. Stable soft tissue adjacent to the right internal 
iliac vessels. 
STOMACH, SMALL BOWEL AND COLON: No bowel wall thickening or obstruction. 
VASCULAR STRUCTURES: Atherosclerotic changes without aneurysmal dilatation.  
MUSCULOSKELETAL: Degenerative changes, osteopenia and multiple compression 
fractures previously treated with vertebroplasty similar to the prior exam.  
ADDITIONAL FINDINGS: Uterus and adnexa are unremarkable in appearance.
IMPRESSION: Stable soft tissue opacity adjacent to the left lateral aspect of the lower 
thoracic spine has been stable dating back to 05/01/2022.  
Stable soft tissue adjacent to the right internal iliac vessels. 
Stable mediastinal lymph nodes. No new thoracic, abdominal or pelvic adenopathy 
Stable chronic left effusion and associated compressive atelectasis in the left 
lower lobe. 
Persistent peribronchial thickening/bronchiectasis with mucous plugging with 
scattered areas of atelectasis that are greatest within the lingula and right 
middle lobe. 
Stable prominence left collecting system and ureter. 
RADIATION DOSE REDUCTION: All CT scans are performed using radiation dose 
reduction techniques, when applicable.  Technical factors are evaluated and 
adjusted to ensure appropriate moderation of exposure.  Automated dose 
management technology is applied to adjust the radiation doses to minimize 
exposure while achieving diagnostic quality images.
